# Patient Record
Sex: Female | Born: 1950 | Race: Black or African American | Hispanic: No | Marital: Single | Smoking: Former smoker
Health system: Southern US, Community
[De-identification: ages and names within clinical notes are randomized; demographics above are authoritative.]

## PROBLEM LIST (undated history)

## (undated) HISTORY — PX: ABDOMINAL HYSTERECTOMY: SHX81

## (undated) HISTORY — PX: TONSILLECTOMY: SUR1361

## (undated) HISTORY — PX: APPENDECTOMY: SHX54

---

## 2003-01-18 ENCOUNTER — Other Ambulatory Visit: Admission: RE | Admit: 2003-01-18 | Discharge: 2003-01-18 | Payer: Self-pay | Admitting: Family Medicine

## 2003-02-21 ENCOUNTER — Encounter: Payer: Self-pay | Admitting: Family Medicine

## 2003-02-21 ENCOUNTER — Encounter: Admission: RE | Admit: 2003-02-21 | Discharge: 2003-02-21 | Payer: Self-pay | Admitting: Family Medicine

## 2003-04-03 ENCOUNTER — Ambulatory Visit (HOSPITAL_COMMUNITY): Admission: RE | Admit: 2003-04-03 | Discharge: 2003-04-03 | Payer: Self-pay | Admitting: Gastroenterology

## 2004-03-20 ENCOUNTER — Emergency Department (HOSPITAL_COMMUNITY): Admission: EM | Admit: 2004-03-20 | Discharge: 2004-03-20 | Payer: Self-pay | Admitting: Emergency Medicine

## 2005-02-21 ENCOUNTER — Other Ambulatory Visit: Admission: RE | Admit: 2005-02-21 | Discharge: 2005-02-21 | Payer: Self-pay | Admitting: Family Medicine

## 2005-03-25 ENCOUNTER — Encounter: Admission: RE | Admit: 2005-03-25 | Discharge: 2005-03-25 | Payer: Self-pay | Admitting: Family Medicine

## 2006-11-03 ENCOUNTER — Emergency Department (HOSPITAL_COMMUNITY): Admission: EM | Admit: 2006-11-03 | Discharge: 2006-11-03 | Payer: Self-pay | Admitting: Emergency Medicine

## 2006-11-05 ENCOUNTER — Ambulatory Visit (HOSPITAL_COMMUNITY): Admission: RE | Admit: 2006-11-05 | Discharge: 2006-11-05 | Payer: Self-pay | Admitting: Orthopedic Surgery

## 2007-03-18 ENCOUNTER — Encounter: Admission: RE | Admit: 2007-03-18 | Discharge: 2007-03-18 | Payer: Self-pay | Admitting: Family Medicine

## 2007-04-07 ENCOUNTER — Other Ambulatory Visit: Admission: RE | Admit: 2007-04-07 | Discharge: 2007-04-07 | Payer: Self-pay | Admitting: Family Medicine

## 2008-09-19 ENCOUNTER — Other Ambulatory Visit: Admission: RE | Admit: 2008-09-19 | Discharge: 2008-09-19 | Payer: Self-pay | Admitting: Family Medicine

## 2008-10-17 ENCOUNTER — Encounter: Admission: RE | Admit: 2008-10-17 | Discharge: 2008-10-17 | Payer: Self-pay | Admitting: Family Medicine

## 2008-11-16 ENCOUNTER — Encounter (INDEPENDENT_AMBULATORY_CARE_PROVIDER_SITE_OTHER): Payer: Self-pay | Admitting: Obstetrics and Gynecology

## 2008-11-16 ENCOUNTER — Ambulatory Visit (HOSPITAL_COMMUNITY): Admission: RE | Admit: 2008-11-16 | Discharge: 2008-11-16 | Payer: Self-pay | Admitting: Obstetrics and Gynecology

## 2009-02-21 ENCOUNTER — Encounter (INDEPENDENT_AMBULATORY_CARE_PROVIDER_SITE_OTHER): Payer: Self-pay | Admitting: Obstetrics and Gynecology

## 2009-02-21 ENCOUNTER — Ambulatory Visit (HOSPITAL_COMMUNITY): Admission: RE | Admit: 2009-02-21 | Discharge: 2009-02-22 | Payer: Self-pay | Admitting: Obstetrics and Gynecology

## 2009-10-19 ENCOUNTER — Encounter: Admission: RE | Admit: 2009-10-19 | Discharge: 2009-10-19 | Payer: Self-pay | Admitting: Family Medicine

## 2010-06-01 ENCOUNTER — Encounter: Payer: Self-pay | Admitting: Family Medicine

## 2010-06-03 ENCOUNTER — Encounter: Payer: Self-pay | Admitting: Family Medicine

## 2010-08-15 LAB — CBC
HCT: 33.7 % — ABNORMAL LOW (ref 36.0–46.0)
HCT: 33.7 % — ABNORMAL LOW (ref 36.0–46.0)
Hemoglobin: 11.1 g/dL — ABNORMAL LOW (ref 12.0–15.0)
Platelets: 115 10*3/uL — ABNORMAL LOW (ref 150–400)
RBC: 3.68 MIL/uL — ABNORMAL LOW (ref 3.87–5.11)
RBC: 3.72 MIL/uL — ABNORMAL LOW (ref 3.87–5.11)
RDW: 13.5 % (ref 11.5–15.5)
WBC: 10.4 10*3/uL (ref 4.0–10.5)

## 2010-08-15 LAB — BASIC METABOLIC PANEL
BUN: 2 mg/dL — ABNORMAL LOW (ref 6–23)
CO2: 29 mEq/L (ref 19–32)
CO2: 29 mEq/L (ref 19–32)
Calcium: 8.6 mg/dL (ref 8.4–10.5)
Chloride: 103 mEq/L (ref 96–112)
Creatinine, Ser: 0.81 mg/dL (ref 0.4–1.2)
Creatinine, Ser: 0.88 mg/dL (ref 0.4–1.2)
GFR calc non Af Amer: >60 mL/min (ref >60)
Glucose, Bld: 111 mg/dL — ABNORMAL HIGH (ref 70–99)
Glucose, Bld: 122 mg/dL — ABNORMAL HIGH (ref 70–99)
Potassium: 3 mEq/L — ABNORMAL LOW (ref 3.5–5.1)
Sodium: 136 mEq/L (ref 135–145)

## 2010-08-18 LAB — CBC
Hemoglobin: 13.8 g/dL (ref 12.0–15.0)
MCHC: 34.3 g/dL (ref 30.0–36.0)
RBC: 4.46 MIL/uL (ref 3.87–5.11)
RDW: 13.5 % (ref 11.5–15.5)

## 2010-08-18 LAB — BASIC METABOLIC PANEL
CO2: 26 mEq/L (ref 19–32)
Calcium: 9.6 mg/dL (ref 8.4–10.5)
Chloride: 105 mEq/L (ref 96–112)
Sodium: 137 mEq/L (ref 135–145)

## 2010-09-24 NOTE — Op Note (Signed)
[name], Tiffany Wilcox NO.:  [id number]   MEDICAL RECORD NO.:  [id number]          PATIENT TYPE:  AMB   LOCATION:  SDC                           FACILITY:  [facility]   PHYSICIAN:  Maxie Better, M.D.DATE OF BIRTH:  Oct 28, 1950   DATE OF PROCEDURE:  11/16/2008  DATE OF DISCHARGE:  11/16/2008                               OPERATIVE REPORT   PREOPERATIVE DIAGNOSIS:  Atypical glandular cells on Pap, favor  endometrial source.   POSTOPERATIVE DIAGNOSIS:  Atypical glandular cells of undetermined  significance, submucosal fibroid.   PROCEDURE:  A cold-knife conization and cervical curettage, diagnostic  hysteroscopy, dilation and curettage.   ANESTHESIA:  MAC, paracervical block.   SURGEON:  Maxie Better, MD   ASSISTANT:  None.   DESCRIPTION OF PROCEDURE:  Under adequate monitored anesthesia, the  patient was placed in the dorsal lithotomy position.  She was sterilely  prepped and draped in usual fashion.  Bladder was catheterized for  moderate amount of urine.  A weighted speculum placed in the vagina  anteriorly.  The Sims retractor was then utilized.  The cervix was in  view.  Lugol's solution was applied to the cervix, which was parous.  No  lesions were noted.  A 0 Vicryl figure-of-eight sutures was placed at  the 3 and 9 o'clock positions.  A 1% lidocaine with epinephrine solution  was injected circumferentially in the cervix and the circumferential  incision was then made around the cervical os and carried down in a cone-  like fashion.  Conization was then performed.  At that point, the  specimen was removed and tagged at the 12 o'clock position.  The  endocervical canal was then gently curetted.  The internal os was then  further dilated up to #27 dilator, it would not accept the 29-Pratt  dilator.  A diagnostic hysteroscope was introduced.  A submucosal  fibroid was noted in inferior wall.  The left ostia was seen.  The right  was sclerosed.  The  endometrial cavity appeared to be atrophic, multiple  attempts at further dilating up the cervix to accommodate the  resectoscope was unsuccessful.  The cavity was therefore curetted, and  the diagnostic hysteroscope was reinserted to check the curettage of the  endometrial cavity and scant tissue was obtained.  At that point, the  hysteroscope was removed.  The bed of the cone was then fulgurated with  a ball.  When good hemostasis noted, Gelfoam x2 was placed with Monsel  in the cavity and the suture was tied in the midline overlying the  cervix.  All instruments were then removed from the vagina.  Specimen  with conization, endometrial, and endocervical curettage.  Estimated  blood loss was minimal.  Complications none.  The patient tolerated the  procedure well and was transferred to recovery in stable condition.       Maxie Better, M.D.  Electronically Signed    [place]/MEDQ  D:  11/16/2008  T:  11/17/2008  Job:  [id number]

## 2010-09-24 NOTE — Op Note (Signed)
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   ATTENDING SURGEON:  Madelynn Done, M.D., who was scrubbed and  present for the entire procedure.   ASSISTANT SURGEON:  Dionne Ano. Gramig, M.D., who was scrubbed and  present for reduction and placement of the K-wires.   PROCEDURE:  1. Percutaneous skeletal fixation of an unstable phalangeal shaft      fracture, left ring finger.  2. Radiographs three views left ring finger.   IMPLANTS:  Three 0.045 K-wires.   ANESTHESIA:  General via LMA.   TOURNIQUET TIME:  0 minutes.   COMPLICATIONS:  None.   CONDITION:  The patient was extubated and taken to the recovery room in  good condition.   SURGICAL INDICATIONS:  Tiffany Wilcox is a 60 year old right hand dominant  female who sustained a left ring finger injury in an accident.  The  patient presented to my office two days ago with pain and deformity to  the left ring finger with slight malrotation of the left ring finger and  displaced phalangeal shaft fracture.  The risks, benefits and  alternatives were discussed in detail with the patient and signed  informed consent was obtained on the day of surgery.  The risks include  but are not limited to bleeding, infection, nerve damage, nonunion,  malunion, malrotation, hardware failure, need for further surgery, loss  of motion, and stiffness in the fingers.   DESCRIPTION OF PROCEDURE:  The patient was properly identified in the  preoperative holding and a mark  with a permanent marker made on the left  ring finger to indicate the correct operative site.  The patient then  brought back to the operating room, placed supine on the operating  table.  General anesthesia was administered via LMA.  The patient  received preoperative antibiotics prior to any skin incisions.  A well  padded tourniquet was then placed on the left brachium and sealed with a  1000 drape.  The left upper extremity was prepped with Hibiclens and  sterilely draped.  A time out was called and the correct side was  identified and the surgical procedure was then begun.   Using a pointed reduction clamp, a clamp was then placed percutaneously  through the displaced phalangeal shaft fracture and then the clamp was  applied applying a slight back counter rotation force to obtain adequate  alignment in the AP, oblique, and lateral planes.  After placement of  the reduction clamp, two K-wires were then placed transversely across  the fracture site with good purchase and maintenance of the reduction.  A third K-wire was then placed from a distal to proximal direction in an  oblique fashion.  After placement of all three K-wires, the K-wires were  then cut and then using the mini C-arm, their positions were then  confirmed using the fluoroscopy.  After placement of the internal  fixation, the rotation was checked and there appeared to be improvement  in the rotation and good cascade of the digits in extension as well as  in digital flexion.  The K-wires were then cut and then bent and left  out of the skin.  Xeroform and pin caps were then applied around the pin  sites.  9 mL of 0.25% Marcaine were used to anesthetize the fingers for  a local digital block.  A sterile compressive dressing was then applied  and a well padded ulnar gutter splint was applied.  The patient was  extubated and taken to the recovery room in good condition.   INTRAOPERATIVE RADIOGRAPHS:  Three views of the  left ring finger do show  the three K-wires in place holding the proximal phalanx reduced.  There  was adequate position of the K-wires.   PLAN:  The patient will be discharged to home.  I plan to see her back  in two weeks for splint change, pin check, and reapplication of an ulnar  gutter type splint for a total of four weeks immobilization, pins out at  four weeks.  Then begin active and active assist range of motion at that  point.  Radiographs at the two week mark, four week mark, and then the  eight week mark.      Madelynn Done, MD  Electronically Signed     FWO/MEDQ  D:  11/05/2006  T:  11/06/2006  Job:  [id number]

## 2010-09-27 NOTE — Op Note (Signed)
Tiffany Wilcox, Tiffany Wilcox NO.:  [id number]   MEDICAL RECORD NO.:  [id number]                   PATIENT TYPE:  AMB   LOCATION:  ENDO                                 FACILITY:  [facility]   PHYSICIAN:  Graylin Shiver, M.D.                DATE OF BIRTH:  July 10, 1950   DATE OF PROCEDURE:  04/03/2003  DATE OF DISCHARGE:                                 OPERATIVE REPORT   PROCEDURE:  Colonoscopy.   ENDOSCOPIST:  Graylin Shiver, M.D.   INDICATIONS FOR PROCEDURE:  Screening.   INFORMED CONSENT:  An informed consent was obtained after an explanation of  the risks of bleeding, infection and perforation.   PRE-MEDICATION:  Fentanyl 70 mcg IV, Versed 7 mg IV.   DESCRIPTION OF PROCEDURE:  With the patient in the left lateral decubitus  position, a rectal examination was performed, and no masses were felt.  The  Olympus colonoscope was inserted into the rectum and advanced around the  colon to the cecum.  The cecal landmarks were identified.  The cecum and  ascending colon were normal.  The transverse colon was normal.  The  descending colon and sigmoid revealed diverticulosis.  The rectum was  normal.  She tolerated the procedure well without complications.   IMPRESSION:  Diverticulosis of the left colon.                                               Graylin Shiver, M.D.    SFG/MEDQ  D:  04/03/2003  T:  04/03/2003  Job:  [id number]   cc:   Sigmund Hazel, M.D.  [address]  [address], [place] [address]  Fax: [phone]

## 2011-02-26 LAB — BASIC METABOLIC PANEL
Chloride: 109
GFR calc non Af Amer: >60
Glucose, Bld: 100 — ABNORMAL HIGH
Potassium: 3.4 — ABNORMAL LOW

## 2011-02-26 LAB — CBC
HCT: 40.5
MCV: 88.2
Platelets: 284
RBC: 4.59
RDW: 13.4
WBC: 4.6

## 2011-07-01 ENCOUNTER — Other Ambulatory Visit: Payer: Self-pay | Admitting: Family Medicine

## 2011-07-01 ENCOUNTER — Ambulatory Visit
Admission: RE | Admit: 2011-07-01 | Discharge: 2011-07-01 | Disposition: A | Discharge status: Home / Self Care | Payer: 59 | Source: Ambulatory Visit | Attending: Family Medicine | Admitting: Family Medicine

## 2011-07-01 DIAGNOSIS — R0602 Shortness of breath: Secondary | ICD-10-CM

## 2011-11-28 ENCOUNTER — Other Ambulatory Visit: Payer: Self-pay | Admitting: Family Medicine

## 2011-11-28 DIAGNOSIS — Z1231 Encounter for screening mammogram for malignant neoplasm of breast: Secondary | ICD-10-CM

## 2011-12-12 ENCOUNTER — Ambulatory Visit: Payer: 59

## 2011-12-15 ENCOUNTER — Ambulatory Visit
Admission: RE | Admit: 2011-12-15 | Discharge: 2011-12-15 | Disposition: A | Discharge status: Home / Self Care | Payer: 59 | Source: Ambulatory Visit | Attending: Family Medicine | Admitting: Family Medicine

## 2011-12-15 DIAGNOSIS — Z1231 Encounter for screening mammogram for malignant neoplasm of breast: Secondary | ICD-10-CM

## 2013-06-01 ENCOUNTER — Other Ambulatory Visit: Payer: Self-pay

## 2013-06-01 DIAGNOSIS — Z1231 Encounter for screening mammogram for malignant neoplasm of breast: Secondary | ICD-10-CM

## 2013-06-17 ENCOUNTER — Ambulatory Visit
Admission: RE | Admit: 2013-06-17 | Discharge: 2013-06-17 | Disposition: A | Discharge status: Home / Self Care | Payer: 59 | Source: Ambulatory Visit

## 2013-06-17 DIAGNOSIS — Z1231 Encounter for screening mammogram for malignant neoplasm of breast: Secondary | ICD-10-CM

## 2014-06-14 ENCOUNTER — Other Ambulatory Visit: Payer: Self-pay

## 2014-06-14 DIAGNOSIS — Z1231 Encounter for screening mammogram for malignant neoplasm of breast: Secondary | ICD-10-CM

## 2014-06-22 ENCOUNTER — Ambulatory Visit
Admission: RE | Admit: 2014-06-22 | Discharge: 2014-06-22 | Disposition: A | Discharge status: Home / Self Care | Payer: 59 | Source: Ambulatory Visit

## 2014-06-22 ENCOUNTER — Encounter (INDEPENDENT_AMBULATORY_CARE_PROVIDER_SITE_OTHER): Payer: Self-pay

## 2014-06-22 DIAGNOSIS — Z1231 Encounter for screening mammogram for malignant neoplasm of breast: Secondary | ICD-10-CM

## 2018-05-26 ENCOUNTER — Other Ambulatory Visit: Payer: Self-pay | Admitting: Family Medicine

## 2018-05-26 DIAGNOSIS — Z1231 Encounter for screening mammogram for malignant neoplasm of breast: Secondary | ICD-10-CM

## 2018-05-26 DIAGNOSIS — E2839 Other primary ovarian failure: Secondary | ICD-10-CM

## 2018-07-23 ENCOUNTER — Ambulatory Visit
Admission: RE | Admit: 2018-07-23 | Discharge: 2018-07-23 | Disposition: A | Discharge status: Home / Self Care | Payer: Medicare Other | Source: Ambulatory Visit | Attending: Family Medicine | Admitting: Family Medicine

## 2018-07-23 ENCOUNTER — Other Ambulatory Visit: Payer: Self-pay

## 2018-07-23 DIAGNOSIS — Z1231 Encounter for screening mammogram for malignant neoplasm of breast: Secondary | ICD-10-CM

## 2018-08-20 ENCOUNTER — Other Ambulatory Visit: Payer: Self-pay

## 2018-11-26 ENCOUNTER — Other Ambulatory Visit: Payer: Self-pay

## 2018-11-26 ENCOUNTER — Ambulatory Visit
Admission: RE | Admit: 2018-11-26 | Discharge: 2018-11-26 | Disposition: A | Discharge status: Home / Self Care | Payer: Medicare Other | Source: Ambulatory Visit | Attending: Family Medicine | Admitting: Family Medicine

## 2018-11-26 DIAGNOSIS — E2839 Other primary ovarian failure: Secondary | ICD-10-CM

## 2019-07-07 ENCOUNTER — Ambulatory Visit: Payer: Medicare HMO | Attending: Internal Medicine

## 2019-07-07 DIAGNOSIS — Z23 Encounter for immunization: Secondary | ICD-10-CM | POA: Insufficient documentation

## 2019-07-07 NOTE — Progress Notes (Signed)
   Covid-19 Vaccination Clinic  Name:  Tiffany Wilcox    MRN: [id number] DOB: 1951-03-31  07/07/2019  [name] was observed post Covid-19 immunization for 15 minutes without incidence. She was provided with Vaccine Information Sheet and instruction to access the V-Safe system.   [name] was instructed to call 911 with any severe reactions post vaccine: Marland Kitchen Difficulty breathing  . Swelling of your face and throat  . A fast heartbeat  . A bad rash all over your body  . Dizziness and weakness    Immunizations Administered    Name Date Dose VIS Date Route   Pfizer COVID-19 Vaccine 07/07/2019 12:20 PM 0.3 mL 04/22/2019 Intramuscular   Manufacturer: [employer], [employer]   Lot: [id number]   NDC: [address]-3

## 2019-07-28 DIAGNOSIS — E785 Hyperlipidemia, unspecified: Secondary | ICD-10-CM | POA: Diagnosis not present

## 2019-07-28 DIAGNOSIS — I10 Essential (primary) hypertension: Secondary | ICD-10-CM | POA: Diagnosis not present

## 2019-07-28 DIAGNOSIS — E2839 Other primary ovarian failure: Secondary | ICD-10-CM | POA: Diagnosis not present

## 2019-07-28 DIAGNOSIS — Z0000 Encounter for general adult medical examination without abnormal findings: Secondary | ICD-10-CM | POA: Diagnosis not present

## 2019-08-02 ENCOUNTER — Ambulatory Visit: Payer: Medicare HMO | Attending: Internal Medicine

## 2019-08-02 DIAGNOSIS — Z23 Encounter for immunization: Secondary | ICD-10-CM

## 2019-08-02 NOTE — Progress Notes (Signed)
   Covid-19 Vaccination Clinic  Name:  Tiffany Wilcox    MRN: [id number] DOB: [birth year]  08/02/2019  Tiffany Wilcox was observed post Covid-19 immunization for 15 minutes without incident. She was provided with Vaccine Information Sheet and instruction to access the V-Safe system.   Tiffany Wilcox was instructed to call 911 with any severe reactions post vaccine: Marland Kitchen Difficulty breathing  . Swelling of face and throat  . A fast heartbeat  . A bad rash all over body  . Dizziness and weakness   Immunizations Administered    Name Date Dose VIS Date Route   Pfizer COVID-19 Vaccine 08/02/2019 12:10 PM 0.3 mL 04/22/2019 Intramuscular   Manufacturer: [employer], [employer]   Lot: [id number]   NDC: [address]-2

## 2020-02-02 DIAGNOSIS — Z23 Encounter for immunization: Secondary | ICD-10-CM | POA: Diagnosis not present

## 2020-02-10 ENCOUNTER — Other Ambulatory Visit: Payer: Medicare HMO

## 2020-02-10 DIAGNOSIS — Z20822 Contact with and (suspected) exposure to covid-19: Secondary | ICD-10-CM | POA: Diagnosis not present

## 2020-02-11 LAB — SARS-COV-2, NAA 2 DAY TAT

## 2020-02-11 LAB — NOVEL CORONAVIRUS, NAA: SARS-CoV-2, NAA: NOT DETECTED

## 2020-02-25 DIAGNOSIS — Z87891 Personal history of nicotine dependence: Secondary | ICD-10-CM | POA: Diagnosis not present

## 2020-02-25 DIAGNOSIS — Z833 Family history of diabetes mellitus: Secondary | ICD-10-CM | POA: Diagnosis not present

## 2020-02-25 DIAGNOSIS — Z8249 Family history of ischemic heart disease and other diseases of the circulatory system: Secondary | ICD-10-CM | POA: Diagnosis not present

## 2020-02-25 DIAGNOSIS — Z803 Family history of malignant neoplasm of breast: Secondary | ICD-10-CM | POA: Diagnosis not present

## 2020-02-25 DIAGNOSIS — G8929 Other chronic pain: Secondary | ICD-10-CM | POA: Diagnosis not present

## 2020-02-25 DIAGNOSIS — M1990 Unspecified osteoarthritis, unspecified site: Secondary | ICD-10-CM | POA: Diagnosis not present

## 2020-08-01 DIAGNOSIS — H539 Unspecified visual disturbance: Secondary | ICD-10-CM | POA: Diagnosis not present

## 2020-08-01 DIAGNOSIS — Z0000 Encounter for general adult medical examination without abnormal findings: Secondary | ICD-10-CM | POA: Diagnosis not present

## 2020-08-01 DIAGNOSIS — E785 Hyperlipidemia, unspecified: Secondary | ICD-10-CM | POA: Diagnosis not present

## 2020-08-01 DIAGNOSIS — M25512 Pain in left shoulder: Secondary | ICD-10-CM | POA: Diagnosis not present

## 2020-08-01 DIAGNOSIS — E2839 Other primary ovarian failure: Secondary | ICD-10-CM | POA: Diagnosis not present

## 2020-08-01 DIAGNOSIS — Z23 Encounter for immunization: Secondary | ICD-10-CM | POA: Diagnosis not present

## 2020-08-01 DIAGNOSIS — I10 Essential (primary) hypertension: Secondary | ICD-10-CM | POA: Diagnosis not present

## 2020-08-02 DIAGNOSIS — M25512 Pain in left shoulder: Secondary | ICD-10-CM | POA: Diagnosis not present

## 2020-08-03 ENCOUNTER — Other Ambulatory Visit: Payer: Self-pay | Admitting: Family Medicine

## 2020-08-03 DIAGNOSIS — Z13820 Encounter for screening for osteoporosis: Secondary | ICD-10-CM

## 2020-08-03 DIAGNOSIS — E2839 Other primary ovarian failure: Secondary | ICD-10-CM

## 2020-08-23 ENCOUNTER — Other Ambulatory Visit: Payer: Self-pay | Admitting: Family Medicine

## 2020-08-23 DIAGNOSIS — Z1231 Encounter for screening mammogram for malignant neoplasm of breast: Secondary | ICD-10-CM

## 2020-09-01 ENCOUNTER — Ambulatory Visit: Payer: Medicare HMO

## 2020-09-05 DIAGNOSIS — M7582 Other shoulder lesions, left shoulder: Secondary | ICD-10-CM | POA: Diagnosis not present

## 2020-09-05 DIAGNOSIS — M65312 Trigger thumb, left thumb: Secondary | ICD-10-CM | POA: Diagnosis not present

## 2020-09-05 DIAGNOSIS — M25511 Pain in right shoulder: Secondary | ICD-10-CM | POA: Diagnosis not present

## 2020-10-03 ENCOUNTER — Encounter: Payer: Medicare HMO | Admitting: Physical Therapy

## 2020-10-03 DIAGNOSIS — Z8042 Family history of malignant neoplasm of prostate: Secondary | ICD-10-CM | POA: Diagnosis not present

## 2020-10-03 DIAGNOSIS — E785 Hyperlipidemia, unspecified: Secondary | ICD-10-CM | POA: Diagnosis not present

## 2020-10-03 DIAGNOSIS — M1990 Unspecified osteoarthritis, unspecified site: Secondary | ICD-10-CM | POA: Diagnosis not present

## 2020-10-03 DIAGNOSIS — Z833 Family history of diabetes mellitus: Secondary | ICD-10-CM | POA: Diagnosis not present

## 2020-10-03 DIAGNOSIS — Z825 Family history of asthma and other chronic lower respiratory diseases: Secondary | ICD-10-CM | POA: Diagnosis not present

## 2020-10-03 DIAGNOSIS — Z87891 Personal history of nicotine dependence: Secondary | ICD-10-CM | POA: Diagnosis not present

## 2020-10-03 DIAGNOSIS — Z803 Family history of malignant neoplasm of breast: Secondary | ICD-10-CM | POA: Diagnosis not present

## 2020-10-03 DIAGNOSIS — Z8249 Family history of ischemic heart disease and other diseases of the circulatory system: Secondary | ICD-10-CM | POA: Diagnosis not present

## 2020-10-03 DIAGNOSIS — G8929 Other chronic pain: Secondary | ICD-10-CM | POA: Diagnosis not present

## 2020-10-09 ENCOUNTER — Ambulatory Visit: Payer: Medicare HMO | Attending: Sports Medicine | Admitting: Occupational Therapy

## 2020-10-09 ENCOUNTER — Encounter: Payer: Self-pay | Admitting: Physical Therapy

## 2020-10-09 ENCOUNTER — Other Ambulatory Visit: Payer: Self-pay

## 2020-10-09 ENCOUNTER — Encounter: Payer: Self-pay | Admitting: Occupational Therapy

## 2020-10-09 ENCOUNTER — Ambulatory Visit: Payer: Medicare HMO | Admitting: Physical Therapy

## 2020-10-09 DIAGNOSIS — M25512 Pain in left shoulder: Secondary | ICD-10-CM | POA: Insufficient documentation

## 2020-10-09 DIAGNOSIS — M79642 Pain in left hand: Secondary | ICD-10-CM | POA: Diagnosis present

## 2020-10-09 DIAGNOSIS — G8929 Other chronic pain: Secondary | ICD-10-CM

## 2020-10-09 DIAGNOSIS — M65312 Trigger thumb, left thumb: Secondary | ICD-10-CM | POA: Diagnosis present

## 2020-10-09 DIAGNOSIS — M25642 Stiffness of left hand, not elsewhere classified: Secondary | ICD-10-CM | POA: Insufficient documentation

## 2020-10-09 NOTE — Therapy (Signed)
[facility] [address], [place], [address] Phone: [phone]   Fax:  [phone]  Physical Therapy Evaluation  Patient Details  Name: Tiffany Wilcox MRN: [id number] Date of Birth: 03-17-1951 No data recorded  Encounter Date: 10/09/2020   PT End of Session - 10/09/20 1302    Visit Number 1    Number of Visits 17    Date for PT Re-Evaluation 12/07/20    PT Start Time 1033    PT Stop Time 1115    PT Time Calculation (min) 42 min    Activity Tolerance Patient tolerated treatment well    Behavior During Therapy [facility] for tasks assessed/performed           History reviewed. No pertinent past medical history.  History reviewed. No pertinent surgical history.  There were no vitals filed for this visit.     OBJECTIVE  MUSCULOSKELETAL: Tremor: Normal Bulk: Normal Tone: Normal  Cervical Screen AROM: WFL and painless with overpressure in all planes; EXCEPT Lateral flex R 30d L 32d with stiffness at bilat UT Spurlings A (ipsilateral lateral flexion/axial compression): NEGATIVE Spurlings B (ipsilateral lateral flexion/contralateral rotation/axial compression): NEGATIVE Repeated movement: No centralization or peripheralization with protraction or retraction  Elbow Screen Elbow AROM: WNL with pain with L ext at proximal bicep  Palpation No pain with palpation to anterior, posterior, lateral, and superior shoulder  Palpation: TTP at proximal bicep insertion, and at distal infraspinatus insertion; concordant pain sites. Secondary pain with trigger points to L UT, and pec minor  Observation: scapular dyskinesis with flexion and abd attempts. Increased UT involvement and shoulder hiking for overhead attempt. Forward head rounded shoulders.   Strength R/L 5/2+ Shoulder flexion (anterior deltoid/pec major/coracobrachialis, axillary n. (C5-6) and musculocutaneous n. (C5-7)) 5/2+ Shoulder abduction  (deltoid/supraspinatus, axillary/suprascapular n, C5) 5/4 Shoulder external rotation (infraspinatus/teres minor) 5/4+ Shoulder internal rotation (subcapularis/lats/pec major) 5/5 Shoulder extension (posterior deltoid, lats, teres major, axillary/thoracodorsal n.) 5/5 Shoulder horizontal abduction 5/5 Elbow flexion (biceps brachii, brachialis, brachioradialis, musculoskeletal n, C5-6) 5/5 Elbow extension (triceps, radial n, C7)  AROM R/L 180/80 Shoulder flexion 180/72 Shoulder abduction C8/C5 Shoulder external rotation T10/S1 Shoulder internal rotation 60/60 Shoulder extension *Indicates pain, overpressure performed unless otherwise indicated  PROM R/L 180/144 Shoulder flexion 180/123 Shoulder abduction 90/71 Shoulder external rotation 70/59 Shoulder internal rotation 60/60 Shoulder extension *Indicates pain, overpressure performed unless otherwise indicated  Accessory Motions/Glides Glenohumeral: All WNL with guarding; with patient reporting reduced pain with posterior mobilization  Acromioclavicular:  A and P WNL  Sternoclavicular: All WNL  Scapulothoracic: All WNL with some difficulty with depression d/t muscle tension   Muscle Length Testing Pectoralis Major: WNL Pectoralis Minor: WNL with tension on L Biceps: WNL with tension at end range L elbow ext  NEUROLOGICAL:  Mental Status Patient is oriented to person, place and time.  Recent memory is intact.  Remote memory is intact.  Attention span and concentration are intact.  Expressive speech is intact.  Patient's fund of knowledge is within normal limits for educational level.   Sensation Grossly intact to light touch bilateral UE as determined by testing dermatomes C2-T2 Proprioception and hot/cold testing deferred on this date  SPECIAL TESTS  Rotator Cuff  Drop Arm Test:Negative  Painful Arc (Pain from 60 to 120 degrees scaption): Positive at 80d Infraspinatus Muscle Test:Negative   Subacromial  Impingement Hawkins-Kennedy:Postiive  Neer (Block scapula, PROM flexion):positive Painful Arc (Pain from 60 to 120 degrees scaption): Positive Empty Can: Positive External  Rotation Resistance: Positive Horizontal Adduction: Negative  Scapular Assist: Positive  Labral Tear Biceps Load II (120 elevation, full ER, 90 elbow flexion, full supination, resisted elbow flexion): Negative Crank (160 scaption, axial load with IR/ER): Negative  Active Compression Test: Positive  Bicep Tendon Pathology Speed (shoulder flexion to 90, external rotation, full elbow extension, and forearm supination with resistance: Positive  Yergason's (resisted shoulder ER and supination/biceps tendon pathology): positive with "little pain"  Shoulder Instability Sulcus Sign:Negative  Anterior Apprehension: Negative    Ther-Ex PT reviewed the following HEP with patient with patient able to demonstrate a set of the following with min cuing for correction needed. PT educated patient on parameters of therex (how/when to inc/decrease intensity, frequency, rep/set range, stretch hold time, and purpose of therex) with verbalized understanding.  Access Code: [email or web address] Seated Shoulder Abduction AAROM with Pulley Behind - 2-3 x daily - 7 x weekly - 2-3sec hold Seated Shoulder Flexion AAROM with Pulley Behind - 2-3 x daily - 7 x weekly - 2-3sec hold Seated Upper Trapezius Stretch - 2-3 x daily - 7 x weekly - 30sec hold                         Objective measurements completed on examination: See above findings.               PT Education - 10/09/20 1047    Education Details Patient was educated on diagnosis, anatomy and pathology involved, prognosis, role of PT, and was given an HEP, demonstrating exercise with proper form following verbal and tactile cues, and was given a paper hand out to continue exercise at home. Pt was educated on and agreed to plan of care.    Person(s) Educated Patient     Methods Explanation;Demonstration;Verbal cues;Tactile cues    Comprehension Verbalized understanding;Returned demonstration;Tactile cues required;Verbal cues required            PT Short Term Goals - 10/09/20 1311      PT SHORT TERM GOAL #1   Title Pt will be independent with HEP in order to improve strength and decrease pain in order to improve pain-free function at home and work.    Baseline 10/09/20 HEP given    Time 4    Period Weeks    Status New             PT Long Term Goals - 10/09/20 1312      PT LONG TERM GOAL #1   Title Pt will decrease worst pain as reported on NPRS by at least 3 points in order to demonstrate clinically significant reduction in pain.    Baseline 10/09/20 8/10    Time 8    Period Weeks    Status New      PT LONG TERM GOAL #2   Title Pt will demonstrate full L active shoulder ROM in order to complete household ADLs and self care to reflect PLOF    Baseline 10/09/20 flex: 80d abd: 72d ER: C5 IR: S1    Time 8    Period Weeks    Status New      PT LONG TERM GOAL #3   Title Patient will demonstrate all shoulder MMTs to at least 4+/5 in order to complete heavy household ADLs and return to PLOF    Baseline 10/09/20 flex: 2+ abd: 2+ ER: 4  IR: 4+    Time 8    Status New      PT LONG  TERM GOAL #4   Title Patient will increase FOTO score to 67 to demonstrate predicted increase in functional mobility to complete ADLs    Baseline 10/09/20 50    Time 8    Period Weeks    Status New                  Plan - 10/09/20 1317    Clinical Impression Statement Pt is a 70y/o female presenting with L shoulder pain. Pt reports RTC tear, through physical exam appears to be infraspinatus; signs and symptoms of bicep tendon pathology as well. Impairments in AROM, PROM, RTC and periscapular strength, pain, scapular dyskinesis, and abnormal posutre. Activity limitations in overhead reaching, bathing, sleeping, donning/doffing clothing, lifting, and  pushing/pulling; inhibiting participation in ind household ADLs. Pt will benefit from skilled PT to address aforementioned impairments to return to optimal PLOF.    Personal Factors and Comorbidities Age;Sex;Fitness;Past/Current Experience;Time since onset of injury/illness/exacerbation    Examination-Activity Limitations Bathing;Reach Overhead;Lift;Carry;Sleep;Bed Mobility    Examination-Participation Restrictions Driving;Yard Work;Meal Prep;Cleaning;Laundry;Community Activity    Stability/Clinical Decision Making Evolving/Moderate complexity    Clinical Decision Making Moderate    Rehab Potential Good    PT Frequency 2x / week    PT Duration 8 weeks    PT Treatment/Interventions ADLs/Self Care Home Management;Iontophoresis 4mg /ml Dexamethasone;Therapeutic exercise;Passive range of motion;Joint Manipulations;Spinal Manipulations;Dry needling;Manual techniques;Neuromuscular re-education;Therapeutic activities;Moist Heat;Electrical Stimulation;Cryotherapy;Ultrasound;DME Instruction;Functional mobility training;Balance training;Patient/family education    PT Next Visit Plan Y/T/I MMT, TDN?    PT Home Exercise Plan pulleys and UT stretch    Consulted and Agree with Plan of Care Patient           Patient will benefit from skilled therapeutic intervention in order to improve the following deficits and impairments:  Decreased coordination,Decreased endurance,Decreased range of motion,Decreased strength,Decreased mobility,Hypomobility,Increased fascial restricitons,Impaired UE functional use,Improper body mechanics,Pain,Postural dysfunction,Impaired flexibility,Increased muscle spasms  Visit Diagnosis: Chronic left shoulder pain     Problem List There are no problems to display for this patient.  DPT Hilda Lias 10/09/2020, 1:40 PM  [facility] [address], [place] Phone: [phone]    Fax:  [phone]  Name: Tiffany Wilcox MRN: Lenore Manner Date of Birth: 1950-06-18

## 2020-10-09 NOTE — Therapy (Signed)
[facility] [address], [place], [address] Phone: [phone]   Fax:  [phone]  Occupational Therapy Evaluation  Patient Details  Name: Tiffany Wilcox MRN: [id number] Date of Birth: Jun 18, 1950 Referring Provider (OT): Dr Sheppard Coil   Encounter Date: 10/09/2020   OT End of Session - 10/09/20 1258    Visit Number 1    Number of Visits 12    Date for OT Re-Evaluation 11/20/20    OT Start Time 0945    OT Stop Time 1030    OT Time Calculation (min) 45 min    Activity Tolerance Patient tolerated treatment well           History reviewed. No pertinent past medical history.  History reviewed. No pertinent surgical history.  There were no vitals filed for this visit.   Subjective Assessment - 10/09/20 1239    Subjective  My trigger thumb started about 2 months ago - I crochet a lot ,and in the garden , work part time in office 2 days a week - but the thumb hurts mostly at night time - but today some what quiet- but it wants to  lock    Pertinent History L trigger thumb started about 2 months ago - her PCP refer her to DR Sheppard Coil -pt decline shot and refer to OT for L trigger thumb    Patient Stated Goals Want my L thumb pain and motion better so I can crochet , work in the yard and use it like before    Currently in Pain? 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, crochet, tai chi, walk, gardening      Right Hand AROM   R Thumb MCP 0-60 30 Degrees    R Thumb IP 0-80 70 Degrees    R Thumb Radial ABduction/ADduction 0-55 60    R Thumb Palmar ABduction/ADduction 0-45 50    R Thumb Opposition to Index --   Opposition to base of 5th     Left Hand AROM   L Thumb MCP 0-60 30 Degrees    L Thumb IP 0-80 60 Degrees    L Thumb Radial ADduction/ABduction 0-55 55    L Thumb Palmar ADduction/ABduction 0-45 50    L Thumb Opposition to Index --   Opposition to 3rd pain full , and cannot do 4th            Pt ed on HEP - contrast - 2 x day  AROM pain free for PA and RA  And opposition - picking up 2 cm object without pain and triggering  Ice massage several times  during day over A1pulley at thumb  And modify grip - use palms or larger joint, avoid lat grip or pinch and enlarge grips   Can use night time bandaid on thumb IP to decrease locking and first day or 2 as reminder during day        OT Treatments/Exercises (OP) - 10/09/20 0001      Iontophoresis   Type of Iontophoresis Dexamethasone    Location L thumb A1pulley    Dose med patch , 2 current    Time 60                 OT Education - 10/09/20 1258    Education Details findings of eval and HEP    Person(s) Educated Patient    Methods Explanation;Verbal cues;Tactile cues;Demonstration;Handout    Comprehension Verbal cues required;Returned demonstration;Verbalized understanding            OT Short Term Goals - 10/09/20 1303      OT SHORT TERM GOAL #1   Title Pt to be indepedent in HEP to decrease pain, tendernes and triggering in L thumb    Baseline pain 4-7/10 and night time can increase to 10/10 , and tender - decrease AROM IP 60 and opposition - triggering several times at night time    Time 3    Period Weeks    Status New    Target Date 10/30/20             OT Long Term Goals - 10/09/20 1304      OT  LONG TERM GOAL #1   Title L thumb AROM improve to WNL to use in ADL's without triggering    Baseline decrease RA and pain with flexion of thumb and opposition .Triggering worse nigh time    Time 5    Target Date 11/13/20      OT LONG TERM GOAL #2   Title Pt be independent in modifications to decrease risk for triggering and return to prior level of function    Baseline no knowledge on modifications and tasks that increase her risk for trigger thumb    Time 6    Period Weeks    Status New    Target Date 11/20/20                 Plan - 10/09/20 1300    Clinical Impression Statement Pt refer to OT with L non dominant trigger thumb. Pt pain in thumb from 4-7/10 , tenderness over A1pulley 10/10 and at night time. Pt show increase stiffness in AROM and decrease strength using L thumb. Pt ed on modfications to tasks , immobilization to IP at night time and to decrease pain and increase AROM withtout triggering and pain - pt limited in use of L hand in ADlL's and IADL's    OT Occupational Profile and History Problem Focused Assessment - Including review of records relating to presenting problem    Occupational performance deficits (Please refer to evaluation for details): ADL's;IADL's;Rest and Sleep;Play;Leisure;Social Participation    Body Structure / Function / Physical Skills ADL;Strength;Decreased knowledge of use of DME;Pain;UE functional use;ROM;IADL;Flexibility;FMC    Rehab Potential Fair    Clinical Decision Making Limited treatment options, no task modification necessary    Comorbidities Affecting Occupational Performance: None    OT Frequency 2x / week    OT Duration 6 weeks    OT Treatment/Interventions Self-care/ADL training;Cryotherapy;Iontophoresis;Ultrasound;Therapeutic exercise;Contrast Bath;Splinting;DME and/or AE instruction;Passive range of motion;Manual Therapy    Consulted and  Agree with Plan of Care Patient           Patient will benefit from skilled therapeutic  intervention in order to improve the following deficits and impairments:   Body Structure / Function / Physical Skills: ADL,Strength,Decreased knowledge of use of DME,Pain,UE functional use,ROM,IADL,Flexibility,FMC       Visit Diagnosis: Trigger thumb of left hand - Plan: Ot plan of care cert/re-cert  Pain in left hand - Plan: Ot plan of care cert/re-cert  Stiffness of left hand, not elsewhere classified - Plan: Ot plan of care cert/re-cert    Problem List There are no problems to display for this patient.   Rosalyn Gess OTR/l,CLT 10/09/2020, 1:09 PM  [facility] [address], [place], [address] Phone: [phone]   Fax:  [phone]  Name: Tiffany Wilcox MRN: [id number] Date of Birth: 05/22/1950

## 2020-10-11 ENCOUNTER — Other Ambulatory Visit: Payer: Self-pay

## 2020-10-11 ENCOUNTER — Ambulatory Visit: Payer: Medicare HMO | Attending: Sports Medicine | Admitting: Occupational Therapy

## 2020-10-11 DIAGNOSIS — G8929 Other chronic pain: Secondary | ICD-10-CM | POA: Diagnosis present

## 2020-10-11 DIAGNOSIS — M79642 Pain in left hand: Secondary | ICD-10-CM

## 2020-10-11 DIAGNOSIS — M25642 Stiffness of left hand, not elsewhere classified: Secondary | ICD-10-CM | POA: Diagnosis present

## 2020-10-11 DIAGNOSIS — M25512 Pain in left shoulder: Secondary | ICD-10-CM | POA: Insufficient documentation

## 2020-10-11 DIAGNOSIS — M65312 Trigger thumb, left thumb: Secondary | ICD-10-CM | POA: Diagnosis not present

## 2020-10-11 NOTE — Therapy (Signed)
[facility] [address] [place], [address] Phone: [phone]   Fax:  [phone]  Occupational Therapy Treatment  Patient Details  Name: Tiffany Wilcox MRN: [id number] Date of Birth: 03-Oct-1950 Referring Provider (OT): Dr Lyn Hollingshead   Encounter Date: 10/11/2020   OT End of Session - 10/11/20 0936    Visit Number 2    Number of Visits 12    Date for OT Re-Evaluation 11/20/20    OT Start Time 0800    OT Stop Time 0833    OT Time Calculation (min) 33 min    Activity Tolerance Patient tolerated treatment well    Behavior During Therapy [facility] for tasks assessed/performed           No past medical history on file.  No past surgical history on file.  There were no vitals filed for this visit.   Subjective Assessment - 10/11/20 0934    Subjective  I did not do anything this morning yet - thumb little sore and stiff- but I could tell my thumb felt better last time after I left    Pertinent History L trigger thumb started about 2 months ago - her PCP refer her to DR Lyn Hollingshead -pt decline shot and refer to OT for L trigger thumb    Patient Stated Goals Want my L thumb pain and motion better so I can crochet , work in the yard and use it like before    Currently in Pain? Yes    Pain Score 4     Pain Location Finger (Comment which one)    Pain Orientation Left    Pain Descriptors / Indicators Tightness    Pain Type Acute pain    Pain Onset More than a month ago    Pain Frequency Intermittent                  Review again with HEP - contrast - 2 x day  AROM pain free for PA and RA  And opposition - picking up 2 cm object without pain and triggering  Ice massage several times during day over A1pulley at thumb  And modify grip - use palms or larger joint, avoid lat grip or pinch and enlarge grips - for gardening  Can use night time bandaid on thumb IP to decrease locking and but not during day  anymore  Skin check done - no issues tolerating ionto - and pt to keep patch on for hour afterwards        OT Treatments/Exercises (OP) - 10/11/20 0001      Iontophoresis   Type of Iontophoresis Dexamethasone    Location L thumb A1pulley    Dose med patch , 2 current    Time 10                  OT Education - 10/11/20 0935    Education Details Ionto use and HEP    Person(s) Educated Patient    Methods Explanation;Verbal cues;Tactile cues;Demonstration;Handout    Comprehension Verbal cues required;Returned demonstration;Verbalized understanding            OT Short Term Goals - 10/09/20 1303      OT SHORT TERM GOAL #1   Title Pt to be indepedent in HEP to decrease pain, tendernes and triggering in L thumb    Baseline pain 4-7/10 and night time can increase to 10/10 , and tender - decrease AROM IP 60 and  opposition - triggering several times at night time    Time 3    Period Weeks    Status New    Target Date 10/30/20             OT Long Term Goals - 10/09/20 1304      OT LONG TERM GOAL #1   Title L thumb AROM improve to WNL to use in ADL's without triggering    Baseline decrease RA and pain with flexion of thumb and opposition .Triggering worse nigh time    Time 5    Target Date 11/13/20      OT LONG TERM GOAL #2   Title Pt be independent in modifications to decrease risk for triggering and return to prior level of function    Baseline no knowledge on modifications and tasks that increase her risk for trigger thumb    Time 6    Period Weeks    Status New    Target Date 11/20/20                 Plan - 10/11/20 0936    Clinical Impression Statement Pt refer to OT with L non dominant trigger thumb. Pt pain at eval in thumb from 4-7/10 , tenderness over A1pulley 10/10 and at night time. Pt show increase stiffness in AROM and decrease strength using L thumb. Pt ed on modfications to tasks again this date and  immobilization to IP at night time  and to decrease pain and increase AROM withtout triggering and pain - 2nd session of ionto done this date - tolerated well - pt limited in use of L hand in ADlL's and IADL's    OT Occupational Profile and History Problem Focused Assessment - Including review of records relating to presenting problem    Occupational performance deficits (Please refer to evaluation for details): ADL's;IADL's;Rest and Sleep;Play;Leisure;Social Participation    Body Structure / Function / Physical Skills ADL;Strength;Decreased knowledge of use of DME;Pain;UE functional use;ROM;IADL;Flexibility;FMC    Clinical Decision Making Limited treatment options, no task modification necessary    Comorbidities Affecting Occupational Performance: None    Modification or Assistance to Complete Evaluation  No modification of tasks or assist necessary to complete eval    OT Frequency 2x / week    OT Treatment/Interventions Self-care/ADL training;Cryotherapy;Iontophoresis;Ultrasound;Therapeutic exercise;Contrast Bath;Splinting;DME and/or AE instruction;Passive range of motion;Manual Therapy    Consulted and Agree with Plan of Care Patient           Patient will benefit from skilled therapeutic intervention in order to improve the following deficits and impairments:   Body Structure / Function / Physical Skills: ADL,Strength,Decreased knowledge of use of DME,Pain,UE functional use,ROM,IADL,Flexibility,FMC       Visit Diagnosis: Trigger thumb of left hand  Pain in left hand  Stiffness of left hand, not elsewhere classified    Problem List There are no problems to display for this patient.   Oletta Cohn OTR/L,CLT 10/11/2020, 9:38 AM  [facility] [address] [place], [address] Phone: [phone]   Fax:  [phone]  Name: Tiffany Wilcox MRN: [id number] Date of Birth: [birth year]

## 2020-10-15 ENCOUNTER — Ambulatory Visit: Payer: Medicare HMO | Admitting: Occupational Therapy

## 2020-10-15 ENCOUNTER — Encounter: Payer: Self-pay | Admitting: Physical Therapy

## 2020-10-15 ENCOUNTER — Other Ambulatory Visit: Payer: Self-pay

## 2020-10-15 ENCOUNTER — Ambulatory Visit: Payer: Medicare HMO | Admitting: Physical Therapy

## 2020-10-15 DIAGNOSIS — M79642 Pain in left hand: Secondary | ICD-10-CM

## 2020-10-15 DIAGNOSIS — G8929 Other chronic pain: Secondary | ICD-10-CM | POA: Diagnosis not present

## 2020-10-15 DIAGNOSIS — M25512 Pain in left shoulder: Secondary | ICD-10-CM

## 2020-10-15 DIAGNOSIS — M25642 Stiffness of left hand, not elsewhere classified: Secondary | ICD-10-CM

## 2020-10-15 DIAGNOSIS — M65312 Trigger thumb, left thumb: Secondary | ICD-10-CM | POA: Diagnosis not present

## 2020-10-15 NOTE — Therapy (Signed)
[facility] [address], [place], [address] Phone: [phone]   Fax:  [phone]  Occupational Therapy Treatment  Patient Details  Name: Tiffany Wilcox MRN: [id number] Date of Birth: 1950-07-16 Referring Provider (OT): Dr Lyn Hollingshead   Encounter Date: 10/15/2020   OT End of Session - 10/15/20 1652    Visit Number 3    Number of Visits 12    Date for OT Re-Evaluation 11/20/20    OT Start Time 1545    OT Stop Time 1630    OT Time Calculation (min) 45 min    Activity Tolerance Patient tolerated treatment well    Behavior During Therapy [facility] for tasks assessed/performed           No past medical history on file.  No past surgical history on file.  There were no vitals filed for this visit.   Subjective Assessment - 10/15/20 1622    Subjective  Little better - I still do bandaid at night time to remind me and when in garden    Pertinent History L trigger thumb started about 2 months ago - her PCP refer her to DR Lyn Hollingshead -pt decline shot and refer to OT for L trigger thumb    Patient Stated Goals Want my L thumb pain and motion better so I can crochet , work in the yard and use it like before    Currently in Pain? Yes    Pain Score 4    tender A1pulley of thumb   Pain Descriptors / Indicators Tender    Pain Type Acute pain    Pain Onset More than a month ago                    Skin check done prior - no issues - tolerated well - pt to keep on for hour afterwards    OT Treatments/Exercises (OP) - 10/15/20 0001      Iontophoresis   Type of Iontophoresis Dexamethasone    Location L thumb A1pulley    Dose med patch , 2 current    Time 19           Pt report doing contrast - 2 x day - feels so much better afterwards AROM pain free for PA and RA  And opposition - picking up 2 cm object without pain and triggering  Can do opposition touching each digit without pain this  date-  Ice massage several times during day over A1pulley at thumb  And modify grip - use palms or larger joint, avoid lat grip or pinch and enlarge grips - for gardening  Can use night time bandaid on thumb IP to decrease locking and but not during day anymore         OT Education - 10/15/20 1651    Education Details Ionto use and HEP    Person(s) Educated Patient    Methods Explanation;Verbal cues;Tactile cues;Demonstration;Handout    Comprehension Verbal cues required;Returned demonstration;Verbalized understanding            OT Short Term Goals - 10/09/20 1303      OT SHORT TERM GOAL #1   Title Pt to be indepedent in HEP to decrease pain, tendernes and triggering in L thumb    Baseline pain 4-7/10 and night time can increase to 10/10 , and tender - decrease AROM IP 60 and opposition - triggering several times at night time    Time 3  Period Weeks    Status New    Target Date 10/30/20             OT Long Term Goals - 10/09/20 1304      OT LONG TERM GOAL #1   Title L thumb AROM improve to WNL to use in ADL's without triggering    Baseline decrease RA and pain with flexion of thumb and opposition .Triggering worse nigh time    Time 5    Target Date 11/13/20      OT LONG TERM GOAL #2   Title Pt be independent in modifications to decrease risk for triggering and return to prior level of function    Baseline no knowledge on modifications and tasks that increase her risk for trigger thumb    Time 6    Period Weeks    Status New    Target Date 11/20/20                 Plan - 10/15/20 1652    Clinical Impression Statement Pt refer to OT with L non dominant trigger thumb. Pt pain at eval in thumb from 4-7/10 , tenderness over A1pulley 10/10 and at night time. Pt  cont to show increase stiffness in AROM and decrease strength using L thumb. Pt ed on modfications to tasks again this date and  immobilization to IP at night time and to decrease pain and  increase AROM withtout triggering and pain - 3rd session of ionto done this date - tolerated well - pt limited in use of L hand in ADlL's and IADL's    OT Occupational Profile and History Problem Focused Assessment - Including review of records relating to presenting problem    Occupational performance deficits (Please refer to evaluation for details): ADL's;IADL's;Rest and Sleep;Play;Leisure;Social Participation    Body Structure / Function / Physical Skills ADL;Strength;Decreased knowledge of use of DME;Pain;UE functional use;ROM;IADL;Flexibility;FMC    Rehab Potential Fair    Clinical Decision Making Limited treatment options, no task modification necessary    Comorbidities Affecting Occupational Performance: None    Modification or Assistance to Complete Evaluation  No modification of tasks or assist necessary to complete eval    OT Frequency 2x / week    OT Duration 6 weeks    OT Treatment/Interventions Self-care/ADL training;Cryotherapy;Iontophoresis;Ultrasound;Therapeutic exercise;Contrast Bath;Splinting;DME and/or AE instruction;Passive range of motion;Manual Therapy    Consulted and Agree with Plan of Care Patient           Patient will benefit from skilled therapeutic intervention in order to improve the following deficits and impairments:   Body Structure / Function / Physical Skills: ADL,Strength,Decreased knowledge of use of DME,Pain,UE functional use,ROM,IADL,Flexibility,FMC       Visit Diagnosis: Trigger thumb of left hand  Pain in left hand  Stiffness of left hand, not elsewhere classified    Problem List There are no problems to display for this patient.   Oletta Cohn OTR/L,CLT 10/15/2020, 4:58 PM  [place] [facility] [address] [place], [address] Phone: [phone]   Fax:  [phone]  Name: Tiffany Wilcox MRN: [id number] Date of Birth: [birth year]

## 2020-10-15 NOTE — Therapy (Signed)
[facility] [address] [place], [address] Phone: [phone]   Fax:  [phone]  Physical Therapy Treatment  Patient Details  Name: Tiffany Wilcox MRN: [id number] Date of Birth: 07/18/1950 No data recorded  Encounter Date: 10/15/2020   PT End of Session - 10/15/20 1554    Visit Number 2    Number of Visits 17    Date for PT Re-Evaluation 12/07/20    PT Start Time 7673    PT Stop Time 1555    PT Time Calculation (min) 40 min    Activity Tolerance Patient tolerated treatment well    Behavior During Therapy [facility] for tasks assessed/performed           History reviewed. No pertinent past medical history.  History reviewed. No pertinent surgical history.  There were no vitals filed for this visit.   Subjective Assessment - 10/15/20 1518    Subjective Pt reports she has been completing her HEP and thinks her motion is getting a little better. Is having 7/10 pain today. Nothing else new to note.    Pertinent History Pt is a 70 year old female presenting with L shoulder pain since Feb 2022. 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 Reports no MOI, just that she started noticing that her shoulder hurt 10/10 pain with overhead reaching. She reports her pain is a C-shape cap over her shoulder, and some on the posterior superior shoulder. She has had ultrasound imaging in April revealing "small RTC tear". Worst pain over the past couple weeks 8/10; best 3/10. Her pain is achy most of the time, but when she lifts her arm she has a sharp and burning. Denies popping/clicking or tingling/numbess. Pain is aggravated by laying on either side, overhead reaching, overhead lifting. Denies pain with pushing and pulling. She is retired, works part time as a Armed forces training and education officer, her job duties does not bother her shoulder, but she is mindful when she carries files. Enjoys gardening and crocheting and has been having  trouble with this since her pain has started. She was completing [place] until Oct of 2021, during a grieving period, and tried to start again in Feb 2022, but her shoulder was too painful. She does report walking multiple times per week. She would like to return Tai Chi 3x/week and swimming 1x/week. Pt denies N/V, B&B changes, unexplained weight fluctuation, saddle paresthesia, fever, night sweats, or unrelenting night pain at this time. No falls in past 86mo.    Limitations Lifting;House hold activities    How long can you sit comfortably? unlimited    How long can you stand comfortably? unlimited    How long can you walk comfortably? unlimited    Diagnostic tests dianostic [facility] April 2022    Patient Stated Goals return to exercises    Pain Onset More than a month ago              Ther-Ex Pulleys flex/abd x15 with 2 sec hold with good carry over of HEP Y on wall x12 with TC needed for scapula depression + retraction with decent carry over Scapular retraction at wall for TC prior to: bilat ER RTB  x12 with cuing for scapulohumeral rhythm with success UT stretch x30sec hold Levator stretch x30sec hold Doorway pec stretch x30sec hold   Manual STM UT, levator scapulae and pec minor with noted trigger points GHJ G3 AP mob w/ abd, ER, IR 4x 30sec each direction; inf  G3 mob flex 4x 30sec bouts                          PT Short Term Goals - 10/09/20 1311      PT SHORT TERM GOAL #1   Title Pt will be independent with HEP in order to improve strength and decrease pain in order to improve pain-free function at home and work.    Baseline 10/09/20 HEP given    Time 4    Period Weeks    Status New             PT Long Term Goals - 10/09/20 1312      PT LONG TERM GOAL #1   Title Pt will decrease worst pain as reported on NPRS by at least 3 points in order to demonstrate clinically significant reduction in pain.    Baseline 10/09/20 8/10    Time 8    Period Weeks     Status New      PT LONG TERM GOAL #2   Title Pt will demonstrate full L active shoulder ROM in order to complete household ADLs and self care to reflect PLOF    Baseline 10/09/20 flex: 80d abd: 72d ER: C5 IR: S1    Time 8    Period Weeks    Status New      PT LONG TERM GOAL #3   Title Patient will demonstrate all shoulder MMTs to at least 4+/5 in order to complete heavy household ADLs and return to PLOF    Baseline 10/09/20 flex: 2+ abd: 2+ ER: 4  IR: 4+    Time 8    Status New      PT LONG TERM GOAL #4   Title Patient will increase FOTO score to 67 to demonstrate predicted increase in functional mobility to complete ADLs    Baseline 10/09/20 50    Time 8    Period Weeks    Status New                 Plan - 10/15/20 1606    Clinical Impression Statement PT initiated therex progresison for increased mobility and scapulohumeral rhythm with success. PT utilized manual techniques to decrease muscle tension of compensatory musculature and pain with success. PT reviewed HEP with patient and updated HEP, where patient is able to verbalize and demonstrate understanding of all HEP exercises. PT willc otninue progression as able.    Personal Factors and Comorbidities Age;Sex;Fitness;Past/Current Experience;Time since onset of injury/illness/exacerbation    Examination-Activity Limitations Bathing;Reach Overhead;Lift;Carry;Sleep;Bed Mobility    Examination-Participation Restrictions Driving;Yard Work;Meal Prep;Cleaning;Laundry;Community Activity    Stability/Clinical Decision Making Evolving/Moderate complexity    Clinical Decision Making Moderate    Rehab Potential Good    PT Frequency 2x / week    PT Duration 8 weeks    PT Treatment/Interventions ADLs/Self Care Home Management;Iontophoresis 82m/ml Dexamethasone;Therapeutic exercise;Passive range of motion;Joint Manipulations;Spinal Manipulations;Dry needling;Manual techniques;Neuromuscular re-education;Therapeutic activities;Moist  Heat;Electrical Stimulation;Cryotherapy;Ultrasound;DME Instruction;Functional mobility training;Balance training;Patient/family education    PT Next Visit Plan Y/T/I MMT, TDN?    PT Home Exercise Plan pulleys and UT stretch    Consulted and Agree with Plan of Care Patient           Patient will benefit from skilled therapeutic intervention in order to improve the following deficits and impairments:  Decreased coordination,Decreased endurance,Decreased range of motion,Decreased strength,Decreased mobility,Hypomobility,Increased fascial restricitons,Impaired UE functional use,Improper body mechanics,Pain,Postural dysfunction,Impaired flexibility,Increased muscle spasms  Visit Diagnosis:  Chronic left shoulder pain     Problem List There are no problems to display for this patient.  Durwin Reges DPT Durwin Reges 10/15/2020, 4:17 PM  [facility] [address] [place], [address] Phone: [phone]   Fax:  [phone]  Name: Tiffany Wilcox MRN: [id number] Date of Birth: December 01, 1950

## 2020-10-17 ENCOUNTER — Other Ambulatory Visit: Payer: Self-pay

## 2020-10-17 ENCOUNTER — Encounter: Payer: Self-pay | Admitting: Physical Therapy

## 2020-10-17 ENCOUNTER — Ambulatory Visit: Payer: Medicare HMO | Admitting: Physical Therapy

## 2020-10-17 DIAGNOSIS — G8929 Other chronic pain: Secondary | ICD-10-CM | POA: Diagnosis not present

## 2020-10-17 DIAGNOSIS — M65312 Trigger thumb, left thumb: Secondary | ICD-10-CM

## 2020-10-17 DIAGNOSIS — M25512 Pain in left shoulder: Secondary | ICD-10-CM | POA: Diagnosis not present

## 2020-10-17 DIAGNOSIS — M79642 Pain in left hand: Secondary | ICD-10-CM | POA: Diagnosis not present

## 2020-10-17 DIAGNOSIS — M25642 Stiffness of left hand, not elsewhere classified: Secondary | ICD-10-CM | POA: Diagnosis not present

## 2020-10-17 NOTE — Therapy (Signed)
[facility] [address] [place], [address] Phone: [phone]   Fax:  [phone]  Physical Therapy Treatment  Patient Details  Name: Tiffany Wilcox MRN: [id number] Date of Birth: 12/01/1950 No data recorded  Encounter Date: 10/17/2020   PT End of Session - 10/17/20 1537    Visit Number 3    Number of Visits 17    Date for PT Re-Evaluation 12/07/20    PT Start Time 4650    PT Stop Time 1555    PT Time Calculation (min) 40 min    Activity Tolerance Patient tolerated treatment well    Behavior During Therapy [facility] for tasks assessed/performed           History reviewed. No pertinent past medical history.  History reviewed. No pertinent surgical history.  There were no vitals filed for this visit.   Subjective Assessment - 10/17/20 1516    Subjective Pt reports she has been completing HEP and doing well overall. Has thought about TDN, and wants to discuss it with her daughter first. Was sore following last session, reports 3/10 pain today.    Pertinent History Pt is a 70 year old female presenting with L shoulder pain since Feb 2022. 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 Reports no MOI, just that she started noticing that her shoulder hurt 10/10 pain with overhead reaching. She reports her pain is a C-shape cap over her shoulder, and some on the posterior superior shoulder. She has had ultrasound imaging in April revealing "small RTC tear". Worst pain over the past couple weeks 8/10; best 3/10. Her pain is achy most of the time, but when she lifts her arm she has a sharp and burning. Denies popping/clicking or tingling/numbess. Pain is aggravated by laying on either side, overhead reaching, overhead lifting. Denies pain with pushing and pulling. She is retired, works part time as a Armed forces training and education officer, her job duties does not bother her shoulder, but she is mindful when she carries files. Enjoys  gardening and crocheting and has been having trouble with this since her pain has started. She was completing [place] until Oct of 2021, during a grieving period, and tried to start again in Feb 2022, but her shoulder was too painful. She does report walking multiple times per week. She would like to return Tai Chi 3x/week and swimming 1x/week. Pt denies N/V, B&B changes, unexplained weight fluctuation, saddle paresthesia, fever, night sweats, or unrelenting night pain at this time. No falls in past 84mo.    Limitations Lifting;House hold activities    How long can you sit comfortably? unlimited    How long can you stand comfortably? unlimited    How long can you walk comfortably? unlimited    Diagnostic tests dianostic [facility] April 2022    Patient Stated Goals return to exercises    Pain Onset More than a month ago            Ther-Ex Pulleys flex/abd x15 with 2 sec hold with good carry over of HEP Supine scapular punches x10; with 2# DB  Y on wall x12 with TC needed for scapula depression + retraction with decent carry over Attempted forearm slides patient with severe difficulty after x6 reps Tband pull aparts GTB 2x 15 with good carry over of demo and initial cuing for proper technique TRX rows 2x 10 with cuing for initial scapular retraction and thumb ext per OT Latissimus stretch x30sec hold Doorway  pec stretch x30sec hold   Manual STM UT, levator scapulae and pec minor with noted trigger points GHJ G3 AP mob w/  ER, IR 4x 30sec each direction; inf G3 mob flex 4x 30sec bouts               PT Education - 10/17/20 1536    Education Details therex form/technique    Person(s) Educated Patient    Methods Explanation;Demonstration;Verbal cues    Comprehension Verbalized understanding;Returned demonstration;Verbal cues required            PT Short Term Goals - 10/09/20 1311      PT SHORT TERM GOAL #1   Title Pt will be independent with HEP in order to improve strength  and decrease pain in order to improve pain-free function at home and work.    Baseline 10/09/20 HEP given    Time 4    Period Weeks    Status New             PT Long Term Goals - 10/09/20 1312      PT LONG TERM GOAL #1   Title Pt will decrease worst pain as reported on NPRS by at least 3 points in order to demonstrate clinically significant reduction in pain.    Baseline 10/09/20 8/10    Time 8    Period Weeks    Status New      PT LONG TERM GOAL #2   Title Pt will demonstrate full L active shoulder ROM in order to complete household ADLs and self care to reflect PLOF    Baseline 10/09/20 flex: 80d abd: 72d ER: C5 IR: S1    Time 8    Period Weeks    Status New      PT LONG TERM GOAL #3   Title Patient will demonstrate all shoulder MMTs to at least 4+/5 in order to complete heavy household ADLs and return to PLOF    Baseline 10/09/20 flex: 2+ abd: 2+ ER: 4  IR: 4+    Time 8    Status New      PT LONG TERM GOAL #4   Title Patient will increase FOTO score to 67 to demonstrate predicted increase in functional mobility to complete ADLs    Baseline 10/09/20 50    Time 8    Period Weeks    Status New                 Plan - 10/17/20 1547    Clinical Impression Statement PT continued therex progression for increased mobility and scapulohumeral rhythm with success. Patien twith less tone in UT and levator this session, but still present with pec minor and latissimus tension as well. Patinet iwth better carry over of multimodal cuing for technique of therex with scapular retraction without shoulder hiking, and for scapulohumeral rhythm with success. Patient is motivated without increased pain throughout session. PT will continue progression as able.    Personal Factors and Comorbidities Age;Sex;Fitness;Past/Current Experience;Time since onset of injury/illness/exacerbation    Examination-Activity Limitations Bathing;Reach Overhead;Lift;Carry;Sleep;Bed Mobility     Examination-Participation Restrictions Driving;Yard Work;Meal Prep;Cleaning;Laundry;Community Activity    Stability/Clinical Decision Making Evolving/Moderate complexity    Clinical Decision Making Moderate    Rehab Potential Good    PT Frequency 2x / week    PT Duration 8 weeks    PT Treatment/Interventions ADLs/Self Care Home Management;Iontophoresis 14m/ml Dexamethasone;Therapeutic exercise;Passive range of motion;Joint Manipulations;Spinal Manipulations;Dry needling;Manual techniques;Neuromuscular re-education;Therapeutic activities;Moist Heat;Electrical Stimulation;Cryotherapy;Ultrasound;DME Instruction;Functional mobility training;Balance training;Patient/family education  PT Next Visit Plan Y/T/I MMT, TDN?    PT Home Exercise Plan pulleys and UT stretch    Consulted and Agree with Plan of Care Patient           Patient will benefit from skilled therapeutic intervention in order to improve the following deficits and impairments:  Decreased coordination,Decreased endurance,Decreased range of motion,Decreased strength,Decreased mobility,Hypomobility,Increased fascial restricitons,Impaired UE functional use,Improper body mechanics,Pain,Postural dysfunction,Impaired flexibility,Increased muscle spasms  Visit Diagnosis: Trigger thumb of left hand     Problem List There are no problems to display for this patient.  Durwin Reges DPT Durwin Reges 10/17/2020, 3:53 PM  [facility] [address], [place], [address] Phone: [phone]   Fax:  [phone]  Name: Tiffany Wilcox MRN: [id number] Date of Birth: October 28, 1950

## 2020-10-18 ENCOUNTER — Ambulatory Visit: Payer: Medicare HMO | Admitting: Occupational Therapy

## 2020-10-18 DIAGNOSIS — M25642 Stiffness of left hand, not elsewhere classified: Secondary | ICD-10-CM | POA: Diagnosis not present

## 2020-10-18 DIAGNOSIS — M65312 Trigger thumb, left thumb: Secondary | ICD-10-CM | POA: Diagnosis not present

## 2020-10-18 DIAGNOSIS — G8929 Other chronic pain: Secondary | ICD-10-CM | POA: Diagnosis not present

## 2020-10-18 DIAGNOSIS — M79642 Pain in left hand: Secondary | ICD-10-CM

## 2020-10-18 DIAGNOSIS — M25512 Pain in left shoulder: Secondary | ICD-10-CM | POA: Diagnosis not present

## 2020-10-18 NOTE — Therapy (Signed)
[facility] [address], [place], [address] Phone: [phone]   Fax:  [phone]  Occupational Therapy Treatment  Patient Details  Name: Iridessa Harrow MRN: [id number] Date of Birth: 1950/07/01 Referring Provider (OT): Dr Lyn Hollingshead   Encounter Date: 10/18/2020   OT End of Session - 10/18/20 1522     Visit Number 4    Number of Visits 12    Date for OT Re-Evaluation 11/20/20    OT Start Time 1523    OT Stop Time 1620    OT Time Calculation (min) 57 min    Activity Tolerance Patient tolerated treatment well    Behavior During Therapy [facility] for tasks assessed/performed             No past medical history on file.  No past surgical history on file.  There were no vitals filed for this visit.   Subjective Assessment - 10/18/20 1522     Subjective  Doing better- my thumb clicking less than 5 x day and pain better - look I can touch all fingers and slide down without pain    Pertinent History L trigger thumb started about 2 months ago - her PCP refer her to DR Lyn Hollingshead -pt decline shot and refer to OT for L trigger thumb    Patient Stated Goals Want my L thumb pain and motion better so I can crochet , work in the yard and use it like before    Currently in Pain? Yes    Pain Score 4    Tender over A1pulley   Pain Location Finger (Comment which one)    Pain Orientation Left    Pain Descriptors / Indicators Tender    Pain Type Acute pain    Pain Onset More than a month ago    Pain Frequency Intermittent                [facility] OT Assessment - 10/18/20 0001       Left Hand AROM   L Thumb MCP 0-60 35 Degrees    L Thumb IP 0-80 70 Degrees    L Thumb Radial ADduction/ABduction 0-55 60    L Thumb Palmar ADduction/ABduction 0-45 70               Pt show increase composite flexion of thumb and opposition - with less locking or clicking and pain       OT Treatments/Exercises (OP) -  10/18/20 0001       Iontophoresis   Type of Iontophoresis Dexamethasone    Location L thumb A1pulley    Dose med patch , 2 current    Time 19      LUE Contrast Bath   Time 8 minutes    Comments prior to soft tissue and ROM             Review again with HEP - contrast - 2 x day AROM pain free for PA and RA And opposition - to all digits and slide down 5th - If no pain and clicking  Cont Ice massage several times during day over A1pulley at thumb And modify grip - use palms or larger joint, avoid lat grip or pinch and enlarge grips - for gardening   Can use night time bandaid on thumb IP to decrease locking and but not during day anymore   Skin check done - no issues tolerating ionto - and pt to keep patch on  for hour afterwards          OT Education - 10/18/20 1522     Education Details Ionto use and HEP    Person(s) Educated Patient    Methods Explanation;Verbal cues;Tactile cues;Demonstration;Handout    Comprehension Verbal cues required;Returned demonstration;Verbalized understanding              OT Short Term Goals - 10/09/20 1303       OT SHORT TERM GOAL #1   Title Pt to be indepedent in HEP to decrease pain, tendernes and triggering in L thumb    Baseline pain 4-7/10 and night time can increase to 10/10 , and tender - decrease AROM IP 60 and opposition - triggering several times at night time    Time 3    Period Weeks    Status New    Target Date 10/30/20               OT Long Term Goals - 10/09/20 1304       OT LONG TERM GOAL #1   Title L thumb AROM improve to WNL to use in ADL's without triggering    Baseline decrease RA and pain with flexion of thumb and opposition .Triggering worse nigh time    Time 5    Target Date 11/13/20      OT LONG TERM GOAL #2   Title Pt be independent in modifications to decrease risk for triggering and return to prior level of function    Baseline no knowledge on modifications and tasks that increase her  risk for trigger thumb    Time 6    Period Weeks    Status New    Target Date 11/20/20                   Plan - 10/18/20 1523     Clinical Impression Statement Pt refer to OT with L non dominant trigger thumb. Pt pain at eval in thumb from 4-7/10 , tenderness over A1pulley 10/10 and at night time. Pt  show increase flexion in thumb and opposition to 2nd fold of 5th wtih sligth pull but no clicking - less than 5 x locking during day and pain better with use. Pt to cont use  modfications in tasks and immobilization to IP at night time and to decrease pain and increase AROM withtout triggering and pain - 4th session of ionto done this date - tolerated well - pt limited in use of L hand in ADlL's and IADL's    OT Occupational Profile and History Problem Focused Assessment - Including review of records relating to presenting problem    Occupational performance deficits (Please refer to evaluation for details): ADL's;IADL's;Rest and Sleep;Play;Leisure;Social Participation    Body Structure / Function / Physical Skills ADL;Strength;Decreased knowledge of use of DME;Pain;UE functional use;ROM;IADL;Flexibility;FMC    Clinical Decision Making Limited treatment options, no task modification necessary    Comorbidities Affecting Occupational Performance: None    Modification or Assistance to Complete Evaluation  No modification of tasks or assist necessary to complete eval    OT Frequency 2x / week    OT Duration 6 weeks    OT Treatment/Interventions Self-care/ADL training;Cryotherapy;Iontophoresis;Ultrasound;Therapeutic exercise;Contrast Bath;Splinting;DME and/or AE instruction;Passive range of motion;Manual Therapy    Consulted and Agree with Plan of Care Patient             Patient will benefit from skilled therapeutic intervention in order to improve the following deficits and impairments:   Body  Structure / Function / Physical Skills: ADL, Strength, Decreased knowledge of use of DME,  Pain, UE functional use, ROM, IADL, Flexibility, FMC       Visit Diagnosis: Trigger thumb of left hand  Pain in left hand  Stiffness of left hand, not elsewhere classified    Problem List There are no problems to display for this patient.   Oletta Cohn OTR/L,CLT 10/18/2020, 4:17 PM  [facility] [address], [place], [address] Phone: [phone]   Fax:  [phone]  Name: Annalei Friesz MRN: [id number] Date of Birth: 1950/05/15

## 2020-10-22 ENCOUNTER — Ambulatory Visit: Payer: Medicare HMO | Admitting: Occupational Therapy

## 2020-10-22 ENCOUNTER — Encounter: Payer: Self-pay | Admitting: Physical Therapy

## 2020-10-22 ENCOUNTER — Ambulatory Visit: Payer: Medicare HMO | Admitting: Physical Therapy

## 2020-10-22 ENCOUNTER — Other Ambulatory Visit: Payer: Self-pay

## 2020-10-22 DIAGNOSIS — M25642 Stiffness of left hand, not elsewhere classified: Secondary | ICD-10-CM

## 2020-10-22 DIAGNOSIS — G8929 Other chronic pain: Secondary | ICD-10-CM

## 2020-10-22 DIAGNOSIS — M25512 Pain in left shoulder: Secondary | ICD-10-CM | POA: Diagnosis not present

## 2020-10-22 DIAGNOSIS — M79642 Pain in left hand: Secondary | ICD-10-CM | POA: Diagnosis not present

## 2020-10-22 DIAGNOSIS — M65312 Trigger thumb, left thumb: Secondary | ICD-10-CM

## 2020-10-22 NOTE — Therapy (Signed)
[facility] PHYSICAL AND SPORTS MEDICINE [address], [place], [address] Phone: [phone]   Fax:  [phone]  Occupational Therapy Treatment  Patient Details  Name: Tiffany Wilcox MRN: [id number] Date of Birth: November 29, 1950 Referring Provider (OT): Dr Lyn Hollingshead   Encounter Date: 10/22/2020   OT End of Session - 10/22/20 1620     Visit Number 5    Number of Visits 12    Date for OT Re-Evaluation 11/20/20    OT Start Time 1604    OT Stop Time 1642    OT Time Calculation (min) 38 min    Activity Tolerance Patient tolerated treatment well    Behavior During Therapy [facility] for tasks assessed/performed             No past medical history on file.  No past surgical history on file.  There were no vitals filed for this visit.   Subjective Assessment - 10/22/20 1616     Subjective  No clcking over the weekend - but still sleeping with bandaid night time -and then keeping thumb to side when gripping objects    Pertinent History L trigger thumb started about 2 months ago - her PCP refer her to DR Lyn Hollingshead -pt decline shot and refer to OT for L trigger thumb    Patient Stated Goals Want my L thumb pain and motion better so I can crochet , work in the yard and use it like before    Currently in Pain? Yes    Pain Score 3     Pain Location Finger (Comment which one)    Pain Orientation Left    Pain Descriptors / Indicators Tender    Pain Type Acute pain    Pain Onset More than a month ago    Pain Frequency Intermittent                [facility] OT Assessment - 10/22/20 0001       Left Hand AROM   L Thumb MCP 0-60 35 Degrees    L Thumb IP 0-80 70 Degrees    L Thumb Radial ADduction/ABduction 0-55 60    L Thumb Palmar ADduction/ABduction 0-45 70    L Thumb Opposition to Index --   opposition base of 5th , no locking              Pt show increase composite flexion of thumb and opposition - with less locking or clicking  and pain Some tenderness this date over 1st dorsal compartment - pt compensate with thumb to radial side of hand and into extention when gripping objects Pt ed on gripping object but enlarge grips, loose grip and cup thumb just lightly around the hand            Skin check done - no issues tolerating ionto - and pt to keep patch on for hour afterwards      OT Treatments/Exercises (OP) - 10/18/20 0001                Iontophoresis    Type of Iontophoresis Dexamethasone    Location L thumb A1pulley    Dose med patch , 2 current    Time 19         LUE Contrast Bath    Time 8 minutes    Comments prior to soft tissue and ROM                   Review  again with HEP - contrast - 2 x day AROM pain free for PA and RA And opposition - to all digits and slide down 5th - no pain and clicking Cont Ice massage several times during day over A1pulley at thumb And modify grip - use palms or larger joint, avoid lat grip or pinch and enlarge grips - for gardening   Can use night time bandaid on thumb IP to decrease locking and but not during day anymore                  OT Education - 10/18/20 1522       Education Details Ionto use and HEP    Person(s) Educated            Review again with HEP - contrast - 2 x day AROM pain free for PA and RA And opposition - to all digits and slide down 5th - If no pain and clicking Cont Ice massage several times during day over A1pulley at thumb And modify grip - use palms or larger joint, avoid lat grip or pinch and enlarge grips - for gardening   Can use night time bandaid on thumb IP to decrease locking and but not during day anymore   Skin check done - no issues tolerating ionto - and pt to keep patch on for hour afterwards                OT Education - 10/18/20 1522       Education Details Ionto use and HEP    Person(s) Educated              OT Treatments/Exercises (OP) - 10/22/20 0001       Iontophoresis   Type  of Iontophoresis Dexamethasone    Location L thumb A1pulley    Dose med patch , 2 current    Time 19      LUE Contrast Bath   Time 8 minutes    Comments prior to soft tissue                    OT Education - 10/22/20 1620     Education Details Ionto use and HEP    Person(s) Educated Patient    Methods Explanation;Verbal cues;Tactile cues;Demonstration;Handout    Comprehension Verbal cues required;Returned demonstration;Verbalized understanding              OT Short Term Goals - 10/09/20 1303       OT SHORT TERM GOAL #1   Title Pt to be indepedent in HEP to decrease pain, tendernes and triggering in L thumb    Baseline pain 4-7/10 and night time can increase to 10/10 , and tender - decrease AROM IP 60 and opposition - triggering several times at night time    Time 3    Period Weeks    Status New    Target Date 10/30/20               OT Long Term Goals - 10/09/20 1304       OT LONG TERM GOAL #1   Title L thumb AROM improve to WNL to use in ADL's without triggering    Baseline decrease RA and pain with flexion of thumb and opposition .Triggering worse nigh time    Time 5    Target Date 11/13/20      OT LONG TERM GOAL #2   Title Pt be independent in modifications to decrease risk for  triggering and return to prior level of function    Baseline no knowledge on modifications and tasks that increase her risk for trigger thumb    Time 6    Period Weeks    Status New    Target Date 11/20/20                   Plan - 10/22/20 1621     Clinical Impression Statement Pt refer to OT with L non dominant trigger thumb. Pt pain at eval in thumb from 4-7/10 , tenderness over A1pulley 10/10 and at night time. Pt  show increase flexion in thumb and opposition to 2nd fold of 5th wtihless of pull and no clicking over the weekend - did had some tenderness over the 1st dorsal compartment this date - pt keeping thumb out to side and extention when gripping  objects and bands in PT - pt ed on start to grip objects but enlarge them and light flexoin around the digits with thumb. Pt to cont use  modfications in tasks and immobilization to IP at night time and to decrease pain and increase AROM withtout triggering and pain - 5th session of ionto done this date - tolerated well - pt limited in use of L hand in ADlL's and IADL's    OT Occupational Profile and History Problem Focused Assessment - Including review of records relating to presenting problem    Occupational performance deficits (Please refer to evaluation for details): ADL's;IADL's;Rest and Sleep;Play;Leisure;Social Participation    Body Structure / Function / Physical Skills ADL;Strength;Decreased knowledge of use of DME;Pain;UE functional use;ROM;IADL;Flexibility;FMC    Rehab Potential Fair    Clinical Decision Making Limited treatment options, no task modification necessary    Comorbidities Affecting Occupational Performance: None    Modification or Assistance to Complete Evaluation  No modification of tasks or assist necessary to complete eval    OT Frequency 2x / week    OT Duration 6 weeks    OT Treatment/Interventions Self-care/ADL training;Cryotherapy;Iontophoresis;Ultrasound;Therapeutic exercise;Contrast Bath;Splinting;DME and/or AE instruction;Passive range of motion;Manual Therapy    Consulted and Agree with Plan of Care Patient             Patient will benefit from skilled therapeutic intervention in order to improve the following deficits and impairments:   Body Structure / Function / Physical Skills: ADL, Strength, Decreased knowledge of use of DME, Pain, UE functional use, ROM, IADL, Flexibility, [facility]       Visit Diagnosis: Trigger thumb of left hand  Stiffness of left hand, not elsewhere classified  Pain in left hand    Problem List There are no problems to display for this patient.   Oletta Cohn OTR/L,CLT 10/22/2020, 6:06 PM  [facility]  [facility] PHYSICAL AND SPORTS MEDICINE [address] [place], [address] Phone: [phone]   Fax:  [phone]  Name: Tiffany Wilcox MRN: [id number] Date of Birth: December 02, 1950

## 2020-10-22 NOTE — Therapy (Signed)
[place] PH[facility]ICAL AND SPORTS MEDICINE [address], [place], [address] Phone: [phone]   Fax:  [phone]  Physical Therapy Treatment  Patient Details  Name: Tiffany Wilcox MRN: [id number] Date of Birth: January 04, 1951 No data recorded  Encounter Date: 10/22/2020   PT End of Session - 10/22/20 1606     Visit Number 4    Number of Visits 17    Date for PT Re-Evaluation 12/07/20    PT Start Time 0160    PT Stop Time 1608    PT Time Calculation (min) 38 min    Activity Tolerance Patient tolerated treatment well    Behavior During Therapy [facility] for tasks assessed/performed             History reviewed. No pertinent past medical history.  History reviewed. No pertinent surgical history.  There were no vitals filed for this visit.   Subjective Assessment - 10/22/20 1613     Subjective Pt reports active participation in HEP and continues to notice progress with physical therapy. Pt expressed williness to try DN next visit for L upper trapezius. Pt noted that she does continue to experience L shoulder pain and rates it 3/10 today.    Pertinent History Pt is a 70 year old female presenting with L shoulder pain since Feb 2022. 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 Reports no MOI, just that she started noticing that her shoulder hurt 10/10 pain with overhead reaching. She reports her pain is a C-shape cap over her shoulder, and some on the posterior superior shoulder. She has had ultrasound imaging in April revealing "small RTC tear". Worst pain over the past couple weeks 8/10; best 3/10. Her pain is achy most of the time, but when she lifts her arm she has a sharp and burning. Denies popping/clicking or tingling/numbess. Pain is aggravated by laying on either side, overhead reaching, overhead lifting. Denies pain with pushing and pulling. She is retired, works part time as a Armed forces training and education officer, her job duties does not bother  her shoulder, but she is mindful when she carries files. Enjoys gardening and crocheting and has been having trouble with this since her pain has started. She was completing [place] until Oct of 2021, during a grieving period, and tried to start again in Feb 2022, but her shoulder was too painful. She does report walking multiple times per week. She would like to return Tai Chi 3x/week and swimming 1x/week. Pt denies N/V, B&B changes, unexplained weight fluctuation, saddle paresthesia, fever, night sweats, or unrelenting night pain at this time. No falls in past 67mos.    Limitations Lifting;House hold activities              Manual Therapy  Muscle Energy technique: ER/IR cueing to push into PT hand and meet resistance  Subscapularis pin & stretch  Therapeutic Exercise  Pulleys: Flex, Scaption, Abd 1 x 90 sec each  Serratus punches 2 x 12 2# DB; 1 x 20 sec perturbations cueing to not let arm move with perturbations  Standing Shoulder ER/IR RTB: 2 x 10 reps  Upper Trapezius Stretch: bilat 1 x 30sec each  Bilateral Shoulder Pull GTB aparts 1 x 10 reps                          PT Education - 10/22/20 1612     Education Details Pt educated in therex form and technique.    Person(s)  Educated Patient    Methods Explanation;Demonstration    Comprehension Verbalized understanding;Returned demonstration              PT Short Term Goals - 10/09/20 1311       PT SHORT TERM GOAL #1   Title Pt will be independent with HEP in order to improve strength and decrease pain in order to improve pain-free function at home and work.    Baseline 10/09/20 HEP given    Time 4    Period Weeks    Status New               PT Long Term Goals - 10/09/20 1312       PT LONG TERM GOAL #1   Title Pt will decrease worst pain as reported on NPRS by at least 3 points in order to demonstrate clinically significant reduction in pain.    Baseline 10/09/20 8/10    Time 8     Period Weeks    Status New      PT LONG TERM GOAL #2   Title Pt will demonstrate full L active shoulder ROM in order to complete household ADLs and self care to reflect PLOF    Baseline 10/09/20 flex: 80d abd: 72d ER: C5 IR: S1    Time 8    Period Weeks    Status New      PT LONG TERM GOAL #3   Title Patient will demonstrate all shoulder MMTs to at least 4+/5 in order to complete heavy household ADLs and return to PLOF    Baseline 10/09/20 flex: 2+ abd: 2+ ER: 4  IR: 4+    Time 8    Status New      PT LONG TERM GOAL #4   Title Patient will increase FOTO score to 67 to demonstrate predicted increase in functional mobility to complete ADLs    Baseline 10/09/20 50    Time 8    Period Weeks    Status New                      Plan - 10/22/20 1607     Clinical Impression Statement PT continued therex and manual therapy for increased mobility and scapulohumeral rhythm with success. Pt demonstrated decreased tone in L trapezius evidenced by improvement in posture and decreased shoulder hiking. Pt continues to be motivated and will continue to benefit from skilled therapy in order to ensure safe exercise progression.    Personal Factors and Comorbidities Age;Sex;Fitness;Past/Current Experience;Time since onset of injury/illness/exacerbation    Examination-Activity Limitations Bathing;Reach Overhead;Lift;Carry;Sleep;Bed Mobility    Examination-Participation Restrictions Driving;Yard Work;Meal Prep;Cleaning;Laundry;Community Activity    PT Treatment/Interventions ADLs/Self Care Home Management;Iontophoresis 4mg /ml Dexamethasone;Therapeutic exercise;Passive range of motion;Joint Manipulations;Spinal Manipulations;Dry needling;Manual techniques;Neuromuscular re-education;Therapeutic activities;Moist Heat;Electrical Stimulation;Cryotherapy;Ultrasound;DME Instruction;Functional mobility training;Balance training;Patient/family education    PT Next Visit Plan Y/T/I MMT, TDN?    PT Home  Exercise Plan pulleys and UT stretch             Patient will benefit from skilled therapeutic intervention in order to improve the following deficits and impairments:  Decreased coordination, Decreased endurance, Decreased range of motion, Decreased strength, Decreased mobility, Hypomobility, Increased fascial restricitons, Impaired UE functional use, Improper body mechanics, Pain, Postural dysfunction, Impaired flexibility, Increased muscle spasms  Visit Diagnosis: Chronic left shoulder pain     Problem List There are no problems to display for this patient.   Durwin Reges DPT Sharion Settler, SPT Durwin Reges 10/23/2020, 10:51  AM  [facility]ICAL [facility] SPORTS MEDICINE [address] [place], [address] Phone: [phone]   Fax:  [phone]  Name: Tiffany Wilcox MRN: [id number] Date of Birth: 04/23/1951

## 2020-10-24 ENCOUNTER — Other Ambulatory Visit: Payer: Self-pay

## 2020-10-24 ENCOUNTER — Encounter: Payer: Self-pay | Admitting: Physical Therapy

## 2020-10-24 ENCOUNTER — Ambulatory Visit: Payer: Medicare HMO | Admitting: Occupational Therapy

## 2020-10-24 ENCOUNTER — Ambulatory Visit: Payer: Medicare HMO | Admitting: Physical Therapy

## 2020-10-24 DIAGNOSIS — M25642 Stiffness of left hand, not elsewhere classified: Secondary | ICD-10-CM | POA: Diagnosis not present

## 2020-10-24 DIAGNOSIS — M79642 Pain in left hand: Secondary | ICD-10-CM

## 2020-10-24 DIAGNOSIS — G8929 Other chronic pain: Secondary | ICD-10-CM | POA: Diagnosis not present

## 2020-10-24 DIAGNOSIS — M65312 Trigger thumb, left thumb: Secondary | ICD-10-CM

## 2020-10-24 DIAGNOSIS — M25512 Pain in left shoulder: Secondary | ICD-10-CM

## 2020-10-24 NOTE — Therapy (Signed)
[facility]ORTS MEDICINE [address], [place], [address] Phone: [phone]   Fax:  [phone]  Occupational Therapy Treatment  Patient Details  Name: Tiffany Wilcox MRN: [id number] Date of Birth: 30-Mar-1951 Referring Provider (OT): Dr Lyn Hollingshead   Encounter Date: 10/24/2020   OT End of Session - 10/24/20 1838     Visit Number 6    Number of Visits 12    Date for OT Re-Evaluation 11/20/20    OT Start Time 1550    OT Stop Time 1639    OT Time Calculation (min) 49 min    Activity Tolerance Patient tolerated treatment well    Behavior During Therapy [facility] for tasks assessed/performed             No past medical history on file.  No past surgical history on file.  There were no vitals filed for this visit.   Subjective Assessment - 10/24/20 1558     Subjective  yesterday was not good day - had couple of triggers and some pain - did my bandaid and went to bed - but think it was the way you grippped the bands, but they change it this date to my forearm - that  I did not had to grip it    Pertinent History L trigger thumb started about 2 months ago - her PCP refer her to DR Lyn Hollingshead -pt decline shot and refer to OT for L trigger thumb    Patient Stated Goals Want my L thumb pain and motion better so I can crochet , work in the yard and use it like before    Currently in Pain? Yes    Pain Score 3     Pain Orientation Left    Pain Descriptors / Indicators Tender    Pain Type Acute pain    Pain Onset More than a month ago               Pt show increase composite flexion of thumb and opposition - with less locking or clicking and pain  Pt ed on gripping object lightly but enlarge grips, loose grip and cup thumb just lightly around the hand     Tenderness decrease from 10/10 to 3/10 since eval        Skin check done - no issues tolerating ionto - and pt to keep patch on for hour afterwards      OT  Treatments/Exercises (OP) - 10/18/20 0001                     Iontophoresis    Type of Iontophoresis Dexamethasone    Location L thumb A1pulley    Dose med patch , 2 current    Time 19           LUE Contrast Bath    Time 8 minutes    Comments prior to soft tissue and ROM                   Review again with HEP - contrast - 2 x day AROM pain free for PA and RA And opposition - to all digits and slide down 5th - no pain and clicking Cont Ice massage several times during day over A1pulley at thumb And modify grip - use palms or larger joint, avoid lat grip or pinch and enlarge grips - for gardening   Can use night time bandaid on thumb IP to decrease locking  and but not during day anymore                   OT Treatments/Exercises (OP) - 10/24/20 0001       Iontophoresis   Type of Iontophoresis Dexamethasone    Location L thumb A1pulley    Dose med patch , 2 current    Time 19      LUE Contrast Bath   Time 8 minutes    Comments prior to soft tissue and ROM                    OT Education - 10/24/20 1837     Education Details Ionto use and HEP    Person(s) Educated Patient    Methods Explanation;Verbal cues;Tactile cues;Demonstration;Handout    Comprehension Verbal cues required;Returned demonstration;Verbalized understanding              OT Short Term Goals - 10/09/20 1303       OT SHORT TERM GOAL #1   Title Pt to be indepedent in HEP to decrease pain, tendernes and triggering in L thumb    Baseline pain 4-7/10 and night time can increase to 10/10 , and tender - decrease AROM IP 60 and opposition - triggering several times at night time    Time 3    Period Weeks    Status New    Target Date 10/30/20               OT Long Term Goals - 10/09/20 1304       OT LONG TERM GOAL #1   Title L thumb AROM improve to WNL to use in ADL's without triggering    Baseline decrease RA and pain with flexion of thumb and opposition  .Triggering worse nigh time    Time 5    Target Date 11/13/20      OT LONG TERM GOAL #2   Title Pt be independent in modifications to decrease risk for triggering and return to prior level of function    Baseline no knowledge on modifications and tasks that increase her risk for trigger thumb    Time 6    Period Weeks    Status New    Target Date 11/20/20                   Plan - 10/24/20 1838     Clinical Impression Statement Pt refer to OT with L non dominant trigger thumb. Pt pain at eval in thumb from 4-7/10 , tenderness over A1pulley 10/10 and at night time. Pain decrease to 3/10.Pt  show increase flexion in thumb and opposition to 2nd fold of 5th with less of pull  - did had some tenderness over the 1st dorsal compartment this last time but improved- pt keeping thumb out to side and extention when gripping objects and bands in PT but improve this date - changed to forearm- pt ed on start to grip objects lightly but enlarge them and light flexoin around the digits with thumb. Pt to cont use  modfications in tasks and immobilization to IP at night time and to decrease pain and increase AROM withtout triggering and pain - 5th session of ionto done this date - tolerated well - pt limited in use of L hand in ADlL's and IADL's    OT Occupational Profile and History Problem Focused Assessment - Including review of records relating to presenting problem    Occupational performance deficits (Please refer  to evaluation for details): ADL's;IADL's;Rest and Sleep;Play;Leisure;Social Participation    Body Structure / Function / Physical Skills ADL;Strength;Decreased knowledge of use of DME;Pain;UE functional use;ROM;IADL;Flexibility;FMC    Clinical Decision Making Limited treatment options, no task modification necessary    Comorbidities Affecting Occupational Performance: None    Modification or Assistance to Complete Evaluation  No modification of tasks or assist necessary to complete eval     OT Frequency 2x / week    OT Duration 6 weeks    OT Treatment/Interventions Self-care/ADL training;Cryotherapy;Iontophoresis;Ultrasound;Therapeutic exercise;Contrast Bath;Splinting;DME and/or AE instruction;Passive range of motion;Manual Therapy    Consulted and Agree with Plan of Care Patient             Patient will benefit from skilled therapeutic intervention in order to improve the following deficits and impairments:   Body Structure / Function / Physical Skills: ADL, Strength, Decreased knowledge of use of DME, Pain, UE functional use, ROM, IADL, Flexibility, [facility]       Visit Diagnosis: Trigger thumb of left hand  Stiffness of left hand, not elsewhere classified  Pain in left hand    Problem List There are no problems to display for this patient.   Oletta Cohn OTR/L,CLT 10/24/2020, 6:42 PM  [facility] [address] [place], [address] Phone: [phone]   Fax:  [phone]  Name: Tiffany Wilcox MRN: [id number] Date of Birth: 02-07-1951

## 2020-10-24 NOTE — Therapy (Signed)
[facility] [address], [place], [address] Phone: [phone]   Fax:  [phone]  Physical Therapy Treatment  Patient Details  Name: Tiffany Wilcox MRN: [id number] Date of Birth: 10-Apr-1951 No data recorded  Encounter Date: 10/24/2020   PT End of Session - 10/24/20 1523     Visit Number 5    Number of Visits 17    Date for PT Re-Evaluation 12/07/20    PT Start Time 1505    PT Stop Time 1545    PT Time Calculation (min) 40 min    Activity Tolerance Patient tolerated treatment well    Behavior During Therapy [facility] for tasks assessed/performed             History reviewed. No pertinent past medical history.  History reviewed. No pertinent surgical history.  There were no vitals filed for this visit.   Subjective Assessment - 10/24/20 1503     Subjective Pt reports to therapy with no concerns of  L shoulder pain at rest. Pt states pain only occurs at certain ranges.    Pertinent History Pt is a 70 year old female presenting with L shoulder pain since Feb 2022. 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 Reports no MOI, just that she started noticing that her shoulder hurt 10/10 pain with overhead reaching. She reports her pain is a C-shape cap over her shoulder, and some on the posterior superior shoulder. She has had ultrasound imaging in April revealing "small RTC tear". Worst pain over the past couple weeks 8/10; best 3/10. Her pain is achy most of the time, but when she lifts her arm she has a sharp and burning. Denies popping/clicking or tingling/numbess. Pain is aggravated by laying on either side, overhead reaching, overhead lifting. Denies pain with pushing and pulling. She is retired, works part time as a Armed forces training and education officer, her job duties does not bother her shoulder, but she is mindful when she carries files. Enjoys gardening and crocheting and has been having trouble with this since her  pain has started. She was completing [place] until Oct of 2021, during a grieving period, and tried to start again in Feb 2022, but her shoulder was too painful. She does report walking multiple times per week. She would like to return Tai Chi 3x/week and swimming 1x/week. Pt denies N/V, B&B changes, unexplained weight fluctuation, saddle paresthesia, fever, night sweats, or unrelenting night pain at this time. No falls in past 31mo.              Therapeutic Exercise  Pulleys: Flex, Scap, ABD x 60 sec each  Standing Shoulder ER GTB 2 x12 reps  TRX Overhead Stretch Flex/ABD2 x 60  TRX Row cues to scap squeeze and bring elbows to pockets 2 x 10 reps  Omega Tricep press down 1x 12 reps #10, 1 x 5 reps #15  Serratus Punches 2# 2 x 10 reps cues to hold 2-3 sec at end of the punch   Manual Therapy  DN: Dry Needling: (2/2) 346m.30 needles placed along the L UT and levator to decrease increased muscular spasms and trigger points with the patient positioned in supine, pincer grasp to trigger points in each area. Patient was educated on risks and benefits of therapy and verbally consents to PT.  PT Education - 10/24/20 1544     Education Details Pt educated on the benefits and adverse effects of DN.    Person(s) Educated Patient    Methods Explanation    Comprehension Verbalized understanding              PT Short Term Goals - 10/09/20 1311       PT SHORT TERM GOAL #1   Title Pt will be independent with HEP in order to improve strength and decrease pain in order to improve pain-free function at home and work.    Baseline 10/09/20 HEP given    Time 4    Period Weeks    Status New               PT Long Term Goals - 10/09/20 1312       PT LONG TERM GOAL #1   Title Pt will decrease worst pain as reported on NPRS by at least 3 points in order to demonstrate clinically significant reduction in pain.    Baseline 10/09/20  8/10    Time 8    Period Weeks    Status New      PT LONG TERM GOAL #2   Title Pt will demonstrate full L active shoulder ROM in order to complete household ADLs and self care to reflect PLOF    Baseline 10/09/20 flex: 80d abd: 72d ER: C5 IR: S1    Time 8    Period Weeks    Status New      PT LONG TERM GOAL #3   Title Patient will demonstrate all shoulder MMTs to at least 4+/5 in order to complete heavy household ADLs and return to PLOF    Baseline 10/09/20 flex: 2+ abd: 2+ ER: 4  IR: 4+    Time 8    Status New      PT LONG TERM GOAL #4   Title Patient will increase FOTO score to 67 to demonstrate predicted increase in functional mobility to complete ADLs    Baseline 10/09/20 50    Time 8    Period Weeks    Status New                   Plan - 10/24/20 1546     Clinical Impression Statement Pt tolerated session well evidenced by no increase in L shoulder pain following therex. Pt does continue to display increased L shoulder ROM and increased periscapular strength. Pt is able to comply with all multimodal cuing for proper techinque of therex with good motivation throughout session. Pt responds well to TDN with visibly decreased shoulder height and decreased palpable tension of levator and UT following. Pt will continue to bendfit from skilled therapy to address impairments and achieve set goals.    Personal Factors and Comorbidities Age;Sex;Fitness;Past/Current Experience;Time since onset of injury/illness/exacerbation    Examination-Activity Limitations Bathing;Reach Overhead;Lift;Carry;Sleep;Bed Mobility    Examination-Participation Restrictions Driving;Yard Work;Meal Prep;Cleaning;Laundry;Community Activity    Stability/Clinical Decision Making Evolving/Moderate complexity    Clinical Decision Making Moderate    Rehab Potential Good    PT Frequency 2x / week    PT Duration 8 weeks    PT Treatment/Interventions ADLs/Self Care Home Management;Iontophoresis 67m/ml  Dexamethasone;Therapeutic exercise;Passive range of motion;Joint Manipulations;Spinal Manipulations;Dry needling;Manual techniques;Neuromuscular re-education;Therapeutic activities;Moist Heat;Electrical Stimulation;Cryotherapy;Ultrasound;DME Instruction;Functional mobility training;Balance training;Patient/family education    PT Next Visit Plan TRX stretches, deltoid strengthing, and long duration weighted stretching    PT Home Exercise Plan pulleys and UT stretch  Consulted and Agree with Plan of Care Patient             Patient will benefit from skilled therapeutic intervention in order to improve the following deficits and impairments:  Decreased coordination, Decreased endurance, Decreased range of motion, Decreased strength, Decreased mobility, Hypomobility, Increased fascial restricitons, Impaired UE functional use, Improper body mechanics, Pain, Postural dysfunction, Impaired flexibility, Increased muscle spasms  Visit Diagnosis: Chronic left shoulder pain     Problem List There are no problems to display for this patient.  Durwin Reges DPT Sharion Settler, SPT  Durwin Reges 10/25/2020, 8:23 AM  [facility] [address], [place], [address] Phone: [phone]   Fax:  [phone]  Name: Tiffany Wilcox MRN: [id number] Date of Birth: 10/04/1950

## 2020-10-29 ENCOUNTER — Ambulatory Visit: Payer: Medicare HMO | Admitting: Occupational Therapy

## 2020-10-29 ENCOUNTER — Other Ambulatory Visit: Payer: Self-pay

## 2020-10-29 ENCOUNTER — Encounter: Payer: Self-pay | Admitting: Physical Therapy

## 2020-10-29 ENCOUNTER — Ambulatory Visit: Payer: Medicare HMO | Admitting: Physical Therapy

## 2020-10-29 DIAGNOSIS — M79642 Pain in left hand: Secondary | ICD-10-CM | POA: Diagnosis not present

## 2020-10-29 DIAGNOSIS — G8929 Other chronic pain: Secondary | ICD-10-CM | POA: Diagnosis not present

## 2020-10-29 DIAGNOSIS — M25642 Stiffness of left hand, not elsewhere classified: Secondary | ICD-10-CM

## 2020-10-29 DIAGNOSIS — M25512 Pain in left shoulder: Secondary | ICD-10-CM | POA: Diagnosis not present

## 2020-10-29 DIAGNOSIS — M65312 Trigger thumb, left thumb: Secondary | ICD-10-CM | POA: Diagnosis not present

## 2020-10-29 NOTE — Therapy (Signed)
[facility] [address] [place], [address] Phone: [phone]   Fax:  [phone]  Occupational Therapy Treatment  Patient Details  Name: Tiffany Wilcox MRN: [id number] Date of Birth: 1950-12-20 Referring Provider (OT): Dr Lyn Hollingshead   Encounter Date: 10/29/2020   OT End of Session - 10/29/20 1006     Visit Number 7    Number of Visits 12    Date for OT Re-Evaluation 11/20/20    OT Start Time 0900    OT Stop Time 0948    OT Time Calculation (min) 48 min    Activity Tolerance Patient tolerated treatment well    Behavior During Therapy [facility] for tasks assessed/performed             No past medical history on file.  No past surgical history on file.  There were no vitals filed for this visit.   Subjective Assessment - 10/29/20 1005     Subjective  DOing okay - better- had out of the last 7 days - 2 days that it clicked some - but the other days good- using it more - with light grip    Pertinent History L trigger thumb started about 2 months ago - her PCP refer her to DR Lyn Hollingshead -pt decline shot and refer to OT for L trigger thumb    Patient Stated Goals Want my L thumb pain and motion better so I can crochet , work in the yard and use it like before    Pain Location Hand    Pain Orientation Left    Pain Descriptors / Indicators Tender    Pain Type Acute pain    Pain Onset More than a month ago    Pain Frequency Intermittent               Pt show increase composite flexion of thumb and opposition - with less locking or clicking and pain Pt do have decrease MC flexion of thumb on bilateral hands  Ed on doing contrast in the am and gentle PROM to IP and MC - for the day and maybe once or 2 again days if not as active Cont with  gripping object lightly but enlarge grips, loose grip and cup thumb just lightly around the hand     Tenderness decrease from 10/10 to 3-5/10 since eval  Had 2  days of clicking out of the 7 days this past week        Skin check done - no issues tolerating ionto - and pt to keep patch on for hour afterwards            OT Treatments/Exercises (OP) - 10/29/20 0001       Iontophoresis   Type of Iontophoresis Dexamethasone    Location L thumb A1pulley    Dose med patch , 2 current    Time 19                 Review again with HEP - contrast - 2 x day AROM pain free for PA and  Gentle PROM to IP and MC , composite of thumb And opposition - to all digits and slide down 5th - no pain and clicking Cont Ice massage several times during day over A1pulley at thumb And modify grip - use palms or larger joint, avoid lat grip or pinch and enlarge grips - for gardening Isometric for strengthening of thumb in 45 degrees of PA of  thumb - gentle 5 reps at time    Can use night time bandaid on thumb IP to decrease locking and but not during day anymore                     OT Education - 10/29/20 1006     Education Details Ionto use and HEP changes    Person(s) Educated Patient    Methods Explanation;Verbal cues;Tactile cues;Demonstration;Handout    Comprehension Verbal cues required;Returned demonstration;Verbalized understanding              OT Short Term Goals - 10/09/20 1303       OT SHORT TERM GOAL #1   Title Pt to be indepedent in HEP to decrease pain, tendernes and triggering in L thumb    Baseline pain 4-7/10 and night time can increase to 10/10 , and tender - decrease AROM IP 60 and opposition - triggering several times at night time    Time 3    Period Weeks    Status New    Target Date 10/30/20               OT Long Term Goals - 10/09/20 1304       OT LONG TERM GOAL #1   Title L thumb AROM improve to WNL to use in ADL's without triggering    Baseline decrease RA and pain with flexion of thumb and opposition .Triggering worse nigh time    Time 5    Target Date 11/13/20      OT LONG TERM GOAL #2    Title Pt be independent in modifications to decrease risk for triggering and return to prior level of function    Baseline no knowledge on modifications and tasks that increase her risk for trigger thumb    Time 6    Period Weeks    Status New    Target Date 11/20/20                   Plan - 10/29/20 1007     Clinical Impression Statement Pt refer to OT with L non dominant trigger thumb. Pt pain at eval in thumb from 4-7/10 , tenderness over A1pulley 10/10 and at night time. Pain decrease to 0-3/10 with use and tenderness still 5/10 at times. Pt  show increase flexion in thumb and opposition to base of 5th with less of pull  - did had some tenderness over the 1st dorsal compartment this last time but improving- pt keeping thumb out to side and extention when gripping objects and bands in PT but improve since last week - changed to forearm- pt ed on start to grip objects lightly but enlarge them and light flexoin around the digits with thumb. Pt to cont use  modfications in tasks and immobilization to IP at night time and to decrease pain and increase AROM withtout triggering and pain - 7 th session of ionto done this date - tolerated well - pt limited in use of L hand in ADlL's and IADL's    OT Occupational Profile and History Problem Focused Assessment - Including review of records relating to presenting problem    Occupational performance deficits (Please refer to evaluation for details): ADL's;IADL's;Rest and Sleep;Play;Leisure;Social Participation    Body Structure / Function / Physical Skills ADL;Strength;Decreased knowledge of use of DME;Pain;UE functional use;ROM;IADL;Flexibility;FMC    Clinical Decision Making Limited treatment options, no task modification necessary    Comorbidities Affecting Occupational Performance: None  Modification or Assistance to Complete Evaluation  No modification of tasks or assist necessary to complete eval    OT Frequency 2x / week    OT Duration  6 weeks    OT Treatment/Interventions Self-care/ADL training;Cryotherapy;Iontophoresis;Ultrasound;Therapeutic exercise;Contrast Bath;Splinting;DME and/or AE instruction;Passive range of motion;Manual Therapy    Consulted and Agree with Plan of Care Patient             Patient will benefit from skilled therapeutic intervention in order to improve the following deficits and impairments:   Body Structure / Function / Physical Skills: ADL, Strength, Decreased knowledge of use of DME, Pain, UE functional use, ROM, IADL, Flexibility, [facility]       Visit Diagnosis: Trigger thumb of left hand  Stiffness of left hand, not elsewhere classified  Pain in left hand    Problem List There are no problems to display for this patient.   Oletta Cohn OTR/L,CLT 10/29/2020, 10:12 AM  [place] [facility] [address], [place], [address] Phone: [phone]   Fax:  [phone]  Name: Tiffany Wilcox MRN: [id number] Date of Birth: 01-03-1951

## 2020-10-29 NOTE — Therapy (Signed)
[facility] [address] [place], [address] Phone: [phone]   Fax:  [phone]  Physical Therapy Treatment  Patient Details  Name: Tiffany Wilcox MRN: [id number] Date of Birth: May 19, 1950 No data recorded  Encounter Date: 10/29/2020   PT End of Session - 10/29/20 0840     Visit Number 6    Number of Visits 17    Date for PT Re-Evaluation 12/07/20    PT Start Time 0820    PT Stop Time 0900    PT Time Calculation (min) 40 min    Activity Tolerance Patient tolerated treatment well    Behavior During Therapy [facility] for tasks assessed/performed             History reviewed. No pertinent past medical history.  History reviewed. No pertinent surgical history.  There were no vitals filed for this visit.   Subjective Assessment - 10/29/20 0824     Subjective Pt reports she felt sore following last session, but thinks she was able to stay a little more "loose" and pay attention to her posture better. No updates since last session. Reports 0/10 pain today.    Pertinent History Pt is a 70 year old female presenting with L shoulder pain since Feb 2022. 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 Reports no MOI, just that she started noticing that her shoulder hurt 10/10 pain with overhead reaching. She reports her pain is a C-shape cap over her shoulder, and some on the posterior superior shoulder. She has had ultrasound imaging in April revealing "small RTC tear". Worst pain over the past couple weeks 8/10; best 3/10. Her pain is achy most of the time, but when she lifts her arm she has a sharp and burning. Denies popping/clicking or tingling/numbess. Pain is aggravated by laying on either side, overhead reaching, overhead lifting. Denies pain with pushing and pulling. She is retired, works part time as a Armed forces training and education officer, her job duties does not bother her shoulder, but she is mindful when she carries  files. Enjoys gardening and crocheting and has been having trouble with this since her pain has started. She was completing [place] until Oct of 2021, during a grieving period, and tried to start again in Feb 2022, but her shoulder was too painful. She does report walking multiple times per week. She would like to return Tai Chi 3x/week and swimming 1x/week. Pt denies N/V, B&B changes, unexplained weight fluctuation, saddle paresthesia, fever, night sweats, or unrelenting night pain at this time. No falls in past 43mo.    Limitations Lifting;House hold activities    How long can you sit comfortably? unlimited    How long can you stand comfortably? unlimited    How long can you walk comfortably? unlimited    Diagnostic tests [facility] April 2022    Patient Stated Goals return to exercises    Pain Onset More than a month ago            Therapeutic Exercise   Pulleys: Flex, Scap, ABD x 60 sec each  Bent over rows 15# 2x 10 with good cuing for proper technique following demo  Standing at wall 90/90 ER 2x 10 with difficulty maintain elbow height to 90d  Push up plus x15 with cuing for scapular movement without elevation with excellent carry over     Manual Therapy STM with trigger point release to L latissimus, L UT and levator following:   DN:  Dry Needling: (2/2) 64m .30 needles placed along the  L latissimus and L UT to decrease increased muscular spasms and trigger points with the patient positioned in supine, pincer grasp to trigger points in each area. Patient was educated on risks and benefits of therapy and verbally consents to PT.  PROM all directions                                      PT Education - 10/29/20 0832     Education Details therex form/technique, TDN    Person(s) Educated Patient    Methods Explanation;Verbal cues;Demonstration    Comprehension Verbalized understanding;Returned demonstration;Verbal cues required               PT Short Term Goals - 10/09/20 1311       PT SHORT TERM GOAL #1   Title Pt will be independent with HEP in order to improve strength and decrease pain in order to improve pain-free function at home and work.    Baseline 10/09/20 HEP given    Time 4    Period Weeks    Status New               PT Long Term Goals - 10/09/20 1312       PT LONG TERM GOAL #1   Title Pt will decrease worst pain as reported on NPRS by at least 3 points in order to demonstrate clinically significant reduction in pain.    Baseline 10/09/20 8/10    Time 8    Period Weeks    Status New      PT LONG TERM GOAL #2   Title Pt will demonstrate full L active shoulder ROM in order to complete household ADLs and self care to reflect PLOF    Baseline 10/09/20 flex: 80d abd: 72d ER: C5 IR: S1    Time 8    Period Weeks    Status New      PT LONG TERM GOAL #3   Title Patient will demonstrate all shoulder MMTs to at least 4+/5 in order to complete heavy household ADLs and return to PLOF    Baseline 10/09/20 flex: 2+ abd: 2+ ER: 4  IR: 4+    Time 8    Status New      PT LONG TERM GOAL #4   Title Patient will increase FOTO score to 67 to demonstrate predicted increase in functional mobility to complete ADLs    Baseline 10/09/20 50    Time 8    Period Weeks    Status New                   Plan - 10/29/20 0910     Clinical Impression Statement PT continued therex progression for increased L shoulder mobility with focus of scapulohumeral rhythm with success. Patient demonstrates good carry over of all multimodal cuing for therex, with good understanding of carry over into functional lifting, pushing/pulling in her garden. Patient continues to tolerate TDN well with increase in flex and abd motion approx 20d following latissimus TDN as well. PT will continue progression as able.    Personal Factors and Comorbidities Age;Sex;Fitness;Past/Current Experience;Time since onset of  injury/illness/exacerbation    Examination-Activity Limitations Bathing;Reach Overhead;Lift;Carry;Sleep;Bed Mobility    Examination-Participation Restrictions Driving;Yard Work;Meal Prep;Cleaning;Laundry;Community Activity    Stability/Clinical Decision Making Evolving/Moderate complexity    Clinical Decision Making Moderate  Rehab Potential Good    PT Frequency 2x / week    PT Duration 8 weeks    PT Treatment/Interventions ADLs/Self Care Home Management;Iontophoresis 43m/ml Dexamethasone;Therapeutic exercise;Passive range of motion;Joint Manipulations;Spinal Manipulations;Dry needling;Manual techniques;Neuromuscular re-education;Therapeutic activities;Moist Heat;Electrical Stimulation;Cryotherapy;Ultrasound;DME Instruction;Functional mobility training;Balance training;Patient/family education    PT Next Visit Plan TRX stretches, deltoid strengthing, and long duration weighted stretching    PT Home Exercise Plan pulleys and UT stretch    Consulted and Agree with Plan of Care Patient             Patient will benefit from skilled therapeutic intervention in order to improve the following deficits and impairments:  Decreased coordination, Decreased endurance, Decreased range of motion, Decreased strength, Decreased mobility, Hypomobility, Increased fascial restricitons, Impaired UE functional use, Improper body mechanics, Pain, Postural dysfunction, Impaired flexibility, Increased muscle spasms  Visit Diagnosis: Chronic left shoulder pain     Problem List There are no problems to display for this patient.  CDurwin RegesDPT CDurwin Reges6/20/2022, 10:06 AM  [facility] [address] [place] [address]Phone: [phone]  Fax:  [phone] Name: Tiffany SchoenfeldtMRN: [id number] of Birth: [birth year]

## 2020-10-31 ENCOUNTER — Ambulatory Visit: Payer: Medicare HMO | Admitting: Physical Therapy

## 2020-10-31 ENCOUNTER — Encounter: Payer: Self-pay | Admitting: Physical Therapy

## 2020-10-31 ENCOUNTER — Ambulatory Visit: Payer: Medicare HMO | Admitting: Occupational Therapy

## 2020-10-31 ENCOUNTER — Other Ambulatory Visit: Payer: Self-pay

## 2020-10-31 DIAGNOSIS — M25642 Stiffness of left hand, not elsewhere classified: Secondary | ICD-10-CM

## 2020-10-31 DIAGNOSIS — M79642 Pain in left hand: Secondary | ICD-10-CM | POA: Diagnosis not present

## 2020-10-31 DIAGNOSIS — M65312 Trigger thumb, left thumb: Secondary | ICD-10-CM | POA: Diagnosis not present

## 2020-10-31 DIAGNOSIS — G8929 Other chronic pain: Secondary | ICD-10-CM

## 2020-10-31 DIAGNOSIS — M25512 Pain in left shoulder: Secondary | ICD-10-CM | POA: Diagnosis not present

## 2020-10-31 NOTE — Therapy (Signed)
[place] [facility] [address] [place], [address] Phone: [phone]   Fax:  [phone]  Physical Therapy Treatment  Patient Details  Name: Tiffany Wilcox MRN: [id number] Date of Birth: 10/15/1950 No data recorded  Encounter Date: 10/31/2020   PT End of Session - 10/31/20 1604     Visit Number 7    Number of Visits 17    Date for PT Re-Evaluation 12/07/20    PT Start Time 9728    PT Stop Time 1555    PT Time Calculation (min) 40 min    Activity Tolerance Patient tolerated treatment well    Behavior During Therapy [facility] for tasks assessed/performed             History reviewed. No pertinent past medical history.  History reviewed. No pertinent surgical history.  There were no vitals filed for this visit.   Subjective Assessment - 10/31/20 1518     Subjective Pt denies any pain or discomfort in L UE. Pt reports that dry needling left her a little sore but feels it improve her motion.    Pertinent History Pt is a 70 year old female presenting with L shoulder pain since Feb 2022. 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 Reports no MOI, just that she started noticing that her shoulder hurt 10/10 pain with overhead reaching. She reports her pain is a C-shape cap over her shoulder, and some on the posterior superior shoulder. She has had ultrasound imaging in April revealing "small RTC tear". Worst pain over the past couple weeks 8/10; best 3/10. Her pain is achy most of the time, but when she lifts her arm she has a sharp and burning. Denies popping/clicking or tingling/numbess. Pain is aggravated by laying on either side, overhead reaching, overhead lifting. Denies pain with pushing and pulling. She is retired, works part time as a Armed forces training and education officer, her job duties does not bother her shoulder, but she is mindful when she carries files. Enjoys gardening and crocheting and has been having trouble with  this since her pain has started. She was completing [place] until Oct of 2021, during a grieving period, and tried to start again in Feb 2022, but her shoulder was too painful. She does report walking multiple times per week. She would like to return Tai Chi 3x/week and swimming 1x/week. Pt denies N/V, B&B changes, unexplained weight fluctuation, saddle paresthesia, fever, night sweats, or unrelenting night pain at this time. No falls in past 38mos.    Limitations Lifting;House hold activities                Therapeutic Exercise:  L sholder AAROM with Dowel FLEX/ER each 1 x 90 sec  L sholder AAROM with Dowel ABD 2 x 90 sec  TRX Low row 2 x 10 reps emphasis on scapular retraction  TRX mid row 2 x 10 emphasis on scapular retraction  Lat pull down #10 1 x 10 reps, 20# 1 x1 0 reps  ER/IR GTB 2 x 10 reps 2-3 sec hold  Small body blade 2 x 60 ea. UE                      PT Education - 11/01/20 1107     Education Details therex form/technique    Person(s) Educated Patient    Methods Explanation;Tactile cues;Verbal cues    Comprehension Verbalized understanding;Returned demonstration;Verbal cues required  PT Short Term Goals - 10/09/20 1311       PT SHORT TERM GOAL #1   Title Pt will be independent with HEP in order to improve strength and decrease pain in order to improve pain-free function at home and work.    Baseline 10/09/20 HEP given    Time 4    Period Weeks    Status New               PT Long Term Goals - 10/09/20 1312       PT LONG TERM GOAL #1   Title Pt will decrease worst pain as reported on NPRS by at least 3 points in order to demonstrate clinically significant reduction in pain.    Baseline 10/09/20 8/10    Time 8    Period Weeks    Status New      PT LONG TERM GOAL #2   Title Pt will demonstrate full L active shoulder ROM in order to complete household ADLs and self care to reflect PLOF    Baseline 10/09/20 flex:  80d abd: 72d ER: C5 IR: S1    Time 8    Period Weeks    Status New      PT LONG TERM GOAL #3   Title Patient will demonstrate all shoulder MMTs to at least 4+/5 in order to complete heavy household ADLs and return to PLOF    Baseline 10/09/20 flex: 2+ abd: 2+ ER: 4  IR: 4+    Time 8    Status New      PT LONG TERM GOAL #4   Title Patient will increase FOTO score to 67 to demonstrate predicted increase in functional mobility to complete ADLs    Baseline 10/09/20 50    Time 8    Period Weeks    Status New                   Plan - 10/31/20 1606     Clinical Impression Statement Pt tolerated session well evidenced by no increase in L shoulder pain throughtout session. Pt continues to progress well with PT with a marked increase in L shoulder AROM. Pt needed multimodal cues to ensure proper form/technique. Pt will continue to progress as able.    Personal Factors and Comorbidities Age;Sex;Fitness;Past/Current Experience;Time since onset of injury/illness/exacerbation    Examination-Activity Limitations Bathing;Reach Overhead;Lift;Carry;Sleep;Bed Mobility    Examination-Participation Restrictions Driving;Yard Work;Meal Prep;Cleaning;Laundry;Community Activity    PT Treatment/Interventions ADLs/Self Care Home Management;Iontophoresis 4mg /ml Dexamethasone;Therapeutic exercise;Passive range of motion;Joint Manipulations;Spinal Manipulations;Dry needling;Manual techniques;Neuromuscular re-education;Therapeutic activities;Moist Heat;Electrical Stimulation;Cryotherapy;Ultrasound;DME Instruction;Functional mobility training;Balance training;Patient/family education    PT Next Visit Plan TRX stretches, deltoid strengthing, and long duration weighted stretching    PT Home Exercise Plan pulleys and UT stretch             Patient will benefit from skilled therapeutic intervention in order to improve the following deficits and impairments:  Decreased coordination, Decreased endurance,  Decreased range of motion, Decreased strength, Decreased mobility, Hypomobility, Increased fascial restricitons, Impaired UE functional use, Improper body mechanics, Pain, Postural dysfunction, Impaired flexibility, Increased muscle spasms  Visit Diagnosis: Chronic left shoulder pain     Problem List There are no problems to display for this patient.   Durwin Reges DPT Sharion Settler, SPT  Durwin Reges 11/01/2020, 11:09 AM  [place] [facility] [address] [place], [address] Phone: [phone]   Fax:  [phone]  Name: Tiffany Wilcox  Tiffany Wilcox MRN: [id number] Date of Birth: 24-Jun-1950

## 2020-10-31 NOTE — Therapy (Signed)
[place] [facility] [address], [place], [address] Phone: [phone]   Fax:  [phone]  Occupational Therapy Treatment  Patient Details  Name: Tiffany Wilcox MRN: [id number] Date of Birth: July 30, 1950 Referring Provider (OT): Dr Lyn Hollingshead   Encounter Date: 10/31/2020   OT End of Session - 10/31/20 1514     Visit Number 8    Number of Visits 12    Date for OT Re-Evaluation 11/20/20    OT Start Time 1445    OT Stop Time 1515    OT Time Calculation (min) 30 min    Activity Tolerance Patient tolerated treatment well    Behavior During Therapy [facility] for tasks assessed/performed             No past medical history on file.  No past surgical history on file.  There were no vitals filed for this visit.   Subjective Assessment - 10/31/20 1512     Subjective  Yesterday had a couple of clicks - but today good - pain better, motion , modifying and amounts of triggering - but still tender    Pertinent History L trigger thumb started about 2 months ago - her PCP refer her to DR Lyn Hollingshead -pt decline shot and refer to OT for L trigger thumb    Patient Stated Goals Want my L thumb pain and motion better so I can crochet , work in the yard and use it like before    Pain Score 5     Pain Location Hand   A1pulley of thumb   Pain Orientation Left    Pain Descriptors / Indicators Tender    Pain Type Acute pain    Pain Onset More than a month ago               Pt show increase composite flexion of thumb and opposition - with less locking or clicking and pain - but cont 5/10 tenderness Pt do have decrease MC flexion of thumb on bilateral hands Cont at home  contrast in the am and gentle PROM to IP and composite flexion of thumb  - for the day and maybe once or 2 again days if not as active Cont with  gripping object lightly but enlarge grips, loose grip and cup thumb just lightly around the hand     Had 2  days of clicking out of the 7 days this past week        Skin check done - no issues tolerating ionto - and pt to keep patch on for hour afterwards                OT Treatments/Exercises (OP) - 10/31/20 0001       Moist Heat Therapy   Number Minutes Moist Heat 5 Minutes    Moist Heat Location Hand      Iontophoresis   Type of Iontophoresis Dexamethasone    Location L thumb A1pulley    Dose med patch , 2 current    Time 19              sof tissue mobs done to webspace , thumb - radial forearm - graston tool nr 2 sweeping  Prior to AAROM of thumb IP , MC and composite       OT Education - 10/31/20 1514     Education Details Ionto use and HEP changes    Person(s) Educated Patient    Methods Explanation;Verbal cues;Tactile  cues;Demonstration;Handout    Comprehension Verbal cues required;Returned demonstration;Verbalized understanding              OT Short Term Goals - 10/09/20 1303       OT SHORT TERM GOAL #1   Title Pt to be indepedent in HEP to decrease pain, tendernes and triggering in L thumb    Baseline pain 4-7/10 and night time can increase to 10/10 , and tender - decrease AROM IP 60 and opposition - triggering several times at night time    Time 3    Period Weeks    Status New    Target Date 10/30/20               OT Long Term Goals - 10/09/20 1304       OT LONG TERM GOAL #1   Title L thumb AROM improve to WNL to use in ADL's without triggering    Baseline decrease RA and pain with flexion of thumb and opposition .Triggering worse nigh time    Time 5    Target Date 11/13/20      OT LONG TERM GOAL #2   Title Pt be independent in modifications to decrease risk for triggering and return to prior level of function    Baseline no knowledge on modifications and tasks that increase her risk for trigger thumb    Time 6    Period Weeks    Status New    Target Date 11/20/20                   Plan - 10/31/20 1514      Clinical Impression Statement Pt refer to OT with L non dominant trigger thumb. Pt pain at eval in thumb from 4-7/10 , tenderness over A1pulley 10/10 and at night time. Pain decrease to 0-3/10 with use and tenderness still 5/10 at times. Pt  show increase flexion in thumb and opposition to base of 5th with less of pull  - pt ed on start to grip objects lightly but enlarge them and light flexoin around the digits with thumb. Pt to cont use  modfications in tasks and immobilization to IP at night time and to decrease pain and increase AROM withtout triggering and pain - 8 th session of ionto done this date - tolerated well - pt limited in use of L hand in ADlL's and IADL's    OT Occupational Profile and History Problem Focused Assessment - Including review of records relating to presenting problem    Occupational performance deficits (Please refer to evaluation for details): ADL's;IADL's;Rest and Sleep;Play;Leisure;Social Participation    Body Structure / Function / Physical Skills ADL;Strength;Decreased knowledge of use of DME;Pain;UE functional use;ROM;IADL;Flexibility;FMC    Rehab Potential Fair    Clinical Decision Making Limited treatment options, no task modification necessary    Comorbidities Affecting Occupational Performance: None    Modification or Assistance to Complete Evaluation  No modification of tasks or assist necessary to complete eval    OT Frequency 2x / week    OT Duration 6 weeks    OT Treatment/Interventions Self-care/ADL training;Cryotherapy;Iontophoresis;Ultrasound;Therapeutic exercise;Contrast Bath;Splinting;DME and/or AE instruction;Passive range of motion;Manual Therapy    Consulted and Agree with Plan of Care Patient             Patient will benefit from skilled therapeutic intervention in order to improve the following deficits and impairments:   Body Structure / Function / Physical Skills: ADL, Strength, Decreased knowledge of use of DME, Pain,  UE functional use, ROM,  IADL, Flexibility, [facility]       Visit Diagnosis: Trigger thumb of left hand  Stiffness of left hand, not elsewhere classified  Pain in left hand    Problem List There are no problems to display for this patient.   Oletta Cohn OTR/L,CLT 10/31/2020, 3:17 PM  [facility] [address], [place], [address] Phone: [phone]   Fax:  [phone]  Name: Krisi Azua MRN: [id number] Date of Birth: March 15, 1951

## 2020-11-01 ENCOUNTER — Encounter: Payer: Self-pay | Admitting: Physical Therapy

## 2020-11-01 ENCOUNTER — Ambulatory Visit
Admission: RE | Admit: 2020-11-01 | Discharge: 2020-11-01 | Disposition: A | Discharge status: Home / Self Care | Payer: Medicare HMO | Source: Ambulatory Visit | Attending: Family Medicine | Admitting: Family Medicine

## 2020-11-01 DIAGNOSIS — Z1231 Encounter for screening mammogram for malignant neoplasm of breast: Secondary | ICD-10-CM

## 2020-11-02 ENCOUNTER — Encounter: Payer: Medicare HMO | Admitting: Physical Therapy

## 2020-11-07 ENCOUNTER — Other Ambulatory Visit: Payer: Self-pay

## 2020-11-07 ENCOUNTER — Ambulatory Visit: Payer: Medicare HMO | Admitting: Occupational Therapy

## 2020-11-07 ENCOUNTER — Ambulatory Visit: Payer: Medicare HMO | Admitting: Physical Therapy

## 2020-11-07 ENCOUNTER — Encounter: Payer: Self-pay | Admitting: Physical Therapy

## 2020-11-07 DIAGNOSIS — M65312 Trigger thumb, left thumb: Secondary | ICD-10-CM

## 2020-11-07 DIAGNOSIS — M79642 Pain in left hand: Secondary | ICD-10-CM | POA: Diagnosis not present

## 2020-11-07 DIAGNOSIS — G8929 Other chronic pain: Secondary | ICD-10-CM | POA: Diagnosis not present

## 2020-11-07 DIAGNOSIS — M25642 Stiffness of left hand, not elsewhere classified: Secondary | ICD-10-CM | POA: Diagnosis not present

## 2020-11-07 DIAGNOSIS — M25512 Pain in left shoulder: Secondary | ICD-10-CM | POA: Diagnosis not present

## 2020-11-07 NOTE — Therapy (Signed)
[place] [facility] [address] [place], [address] Phone: [phone]   Fax:  [phone]  Physical Therapy Treatment  Patient Details  Name: Tiffany Wilcox MRN: [id number] Date of Birth: [birth year] No data recorded  Encounter Date: 11/07/2020   PT End of Session - 11/07/20 1516     Visit Number 8    Number of Visits 17    Date for PT Re-Evaluation 12/07/20    PT Start Time 1510    PT Stop Time 1553    PT Time Calculation (min) 43 min    Activity Tolerance Patient tolerated treatment well    Behavior During Therapy [facility] for tasks assessed/performed             History reviewed. No pertinent past medical history.  History reviewed. No pertinent surgical history.  There were no vitals filed for this visit.   Subjective Assessment - 11/07/20 1513     Subjective Pt reports to therapy with no complaints other than a little L shoulder stiffness and feeling a tired.She feels as if her L shoulder ROM is being maintained. She reports being active in HEP even on busy work days.    Pertinent History Pt is a 70 year old female presenting with L shoulder pain since Feb 2022. 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 Reports no MOI, just that she started noticing that her shoulder hurt 10/10 pain with overhead reaching. She reports her pain is a C-shape cap over her shoulder, and some on the posterior superior shoulder. She has had ultrasound imaging in April revealing "small RTC tear". Worst pain over the past couple weeks 8/10; best 3/10. Her pain is achy most of the time, but when she lifts her arm she has a sharp and burning. Denies popping/clicking or tingling/numbess. Pain is aggravated by laying on either side, overhead reaching, overhead lifting. Denies pain with pushing and pulling. 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 Enjoys gardening and crocheting and has been having trouble with this since her pain has started. She was completing [place] until Oct of 2021, during a grieving period, and tried to start again in Feb 2022, but her shoulder was too painful. She does report walking multiple times per week. She would like to return Tai Chi 3x/week and swimming 1x/week. Pt denies N/V, B&B changes, unexplained weight fluctuation, saddle paresthesia, fever, night sweats, or unrelenting night pain at this time. No falls in past 73mos.    Patient Stated Goals return to exercises             Therapeutic Exercise:     TRX AAROM Flex/ABD 2 x 60 sec   AAROM Dowel ER/IR 1 x 60sec  Bus drivers 5# 3 x 30 sec cues to keep elbows straight  Shoulder press # 10 GTOH 1 x 5 reps; 1 x 5 reps #5 cues to bend elbows and curl the weight close to body before pressing over head  Single arm bent over row with #5 DB 1 x 12 reps, 1 x 8 reps cues to keep elbow close and scap squeeze   Modified push up from wall 2 x 5 reps  Lat pull down #15 2 x 10 reps   ER GTB 2 x 60 sec cued to move slowly on recoil  PT Education - 11/07/20 1558     Education Details therex form/technique    Person(s) Educated Patient    Methods Explanation;Demonstration;Tactile cues;Verbal cues    Comprehension Verbalized understanding;Returned demonstration              PT Short Term Goals - 10/09/20 1311       PT SHORT TERM GOAL #1   Title Pt will be independent with HEP in order to improve strength and decrease pain in order to improve pain-free function at home and work.    Baseline 10/09/20 HEP given    Time 4    Period Weeks    Status New               PT Long Term Goals - 10/09/20 1312       PT LONG TERM GOAL #1   Title Pt will decrease worst pain as reported on NPRS by at least 3 points in order to demonstrate clinically significant reduction in pain.     Baseline 10/09/20 8/10    Time 8    Period Weeks    Status New      PT LONG TERM GOAL #2   Title Pt will demonstrate full L active shoulder ROM in order to complete household ADLs and self care to reflect PLOF    Baseline 10/09/20 flex: 80d abd: 72d ER: C5 IR: S1    Time 8    Period Weeks    Status New      PT LONG TERM GOAL #3   Title Patient will demonstrate all shoulder MMTs to at least 4+/5 in order to complete heavy household ADLs and return to PLOF    Baseline 10/09/20 flex: 2+ abd: 2+ ER: 4  IR: 4+    Time 8    Status New      PT LONG TERM GOAL #4   Title Patient will increase FOTO score to 67 to demonstrate predicted increase in functional mobility to complete ADLs    Baseline 10/09/20 50    Time 8    Period Weeks    Status New                   Plan - 11/07/20 1554     Clinical Impression Statement Pt tolerated session well evidenced by no increase in pain at end of session. Pt continues to improve with L shoulder AROM and WB tolerance this day. Pt needed multimodal cueing to perform therex with good carry over. Pt will continue to benefit from skilled therapy to address impairments and achieve set goals.    Personal Factors and Comorbidities Age;Sex;Fitness;Past/Current Experience;Time since onset of injury/illness/exacerbation    Examination-Activity Limitations Bathing;Reach Overhead;Lift;Carry;Sleep;Bed Mobility    Examination-Participation Restrictions Driving;Yard Work;Meal Prep;Cleaning;Laundry;Community Activity    PT Treatment/Interventions ADLs/Self Care Home Management;Iontophoresis 4mg /ml Dexamethasone;Therapeutic exercise;Passive range of motion;Joint Manipulations;Spinal Manipulations;Dry needling;Manual techniques;Neuromuscular re-education;Therapeutic activities;Moist Heat;Electrical Stimulation;Cryotherapy;Ultrasound;DME Instruction;Functional mobility training;Balance training;Patient/family education    PT Next Visit Plan TRX stretches, deltoid  strengthing, and long duration weighted stretching    PT Home Exercise Plan pulleys and UT stretch    Consulted and Agree with Plan of Care Patient             Patient will benefit from skilled therapeutic intervention in order to improve the following deficits and impairments:  Decreased coordination, Decreased endurance, Decreased range of motion, Decreased strength, Decreased mobility, Hypomobility, Increased fascial restricitons, Impaired UE functional use, Improper body mechanics, Pain, Postural dysfunction, Impaired flexibility, Increased  muscle spasms  Visit Diagnosis: Chronic left shoulder pain     Problem List There are no problems to display for this patient.   Durwin Reges DPT Sharion Settler, SPT  Durwin Reges 11/08/2020, 8:05 AM  [place] [facility] [address] [place], [address] Phone: [phone]   Fax:  [phone]  Name: Tiffany Wilcox MRN: [id number] Date of Birth: [birth year]

## 2020-11-07 NOTE — Therapy (Signed)
[facility] [facility] [address] [place], [address] Phone: [phone]   Fax:  [phone]  Occupational Therapy Treatment  Patient Details  Name: Tiffany Wilcox MRN: [id number] Date of Birth: 20-Aug-1950 Referring Provider (OT): Dr Lyn Hollingshead   Encounter Date: 11/07/2020   OT End of Session - 11/07/20 1813     Visit Number 9    Number of Visits 12    Date for OT Re-Evaluation 11/20/20    OT Start Time 1600    OT Stop Time 1645    OT Time Calculation (min) 45 min    Activity Tolerance Patient tolerated treatment well    Behavior During Therapy [facility] for tasks assessed/performed             No past medical history on file.  No past surgical history on file.  There were no vitals filed for this visit.   Subjective Assessment - 11/07/20 1747     Subjective  Doing well and much better - had only one day of locking of thumb and pain better - using it much more but aware what not to do    Patient Stated Goals Want my L thumb pain and motion better so I can crochet , work in the yard and use it like before    Currently in Pain? Yes    Pain Score 2     Pain Location Hand    Pain Orientation Left    Pain Descriptors / Indicators Tender    Pain Type Acute pain    Pain Onset More than a month ago               Pt show increase composite flexion of thumb and opposition - with less locking or clicking and pain  Some triggering only one day in week and tenderness down to 3/10 at the worse  Very little pain with use  Pt do have decrease MC flexion of thumb on bilateral hands Cont at home  contrast in the am and gentle PROM to IP and composite flexion of thumb  - for the day and maybe once or 2 again days if not as active Cont with  gripping object lightly but enlarge grips, loose grip and cup thumb just lightly around the hand      Had only 1 day of some clicking the last week        Skin check done  - no issues tolerating ionto - and pt to keep patch on for hour afterwards              OT Treatments/Exercises (OP) - 11/07/20 0001       Iontophoresis   Type of Iontophoresis Dexamethasone    Location L thumb A1pulley    Dose med patch , 2 current    Time 33                    OT Education - 11/07/20 1812     Education Details Ionto use and HEP changes    Person(s) Educated Patient    Methods Explanation;Verbal cues;Tactile cues;Demonstration;Handout    Comprehension Verbal cues required;Returned demonstration;Verbalized understanding              OT Short Term Goals - 10/09/20 1303       OT SHORT TERM GOAL #1   Title Pt to be indepedent in HEP to decrease pain, tendernes and triggering in L thumb  Baseline pain 4-7/10 and night time can increase to 10/10 , and tender - decrease AROM IP 60 and opposition - triggering several times at night time    Time 3    Period Weeks    Status New    Target Date 10/30/20               OT Long Term Goals - 10/09/20 1304       OT LONG TERM GOAL #1   Title L thumb AROM improve to WNL to use in ADL's without triggering    Baseline decrease RA and pain with flexion of thumb and opposition .Triggering worse nigh time    Time 5    Target Date 11/13/20      OT LONG TERM GOAL #2   Title Pt be independent in modifications to decrease risk for triggering and return to prior level of function    Baseline no knowledge on modifications and tasks that increase her risk for trigger thumb    Time 6    Period Weeks    Status New    Target Date 11/20/20                   Plan - 11/07/20 1813     Clinical Impression Statement Pt refer to OT with L non dominant trigger thumb. Pt pain at eval in thumb from 4-7/10 , tenderness over A1pulley 10/10 and at night time. Pain decrease to 0-3/10 tenderness but very little pain with use.  Pt  show increase flexion in thumb and opposition to base of 5th with less of  pull  - pt ed on start to grip objects lightly but enlarge them and light flexoin around the digits with thumb. Pt to cont use  modfications in tasks and immobilization to IP at night time and to decrease pain and increase AROM withtout triggering and pain - 9 th session of ionto done this date - tolerated well - pt limited in use of L hand in ADlL's and IADL's    OT Occupational Profile and History Problem Focused Assessment - Including review of records relating to presenting problem    Occupational performance deficits (Please refer to evaluation for details): ADL's;IADL's;Rest and Sleep;Play;Leisure;Social Participation    Body Structure / Function / Physical Skills ADL;Strength;Decreased knowledge of use of DME;Pain;UE functional use;ROM;IADL;Flexibility;FMC    Rehab Potential Fair    Clinical Decision Making Limited treatment options, no task modification necessary    Comorbidities Affecting Occupational Performance: None    Modification or Assistance to Complete Evaluation  No modification of tasks or assist necessary to complete eval    OT Frequency 2x / week    OT Duration 6 weeks    OT Treatment/Interventions Self-care/ADL training;Cryotherapy;Iontophoresis;Ultrasound;Therapeutic exercise;Contrast Bath;Splinting;DME and/or AE instruction;Passive range of motion;Manual Therapy    Consulted and Agree with Plan of Care Patient             Patient will benefit from skilled therapeutic intervention in order to improve the following deficits and impairments:   Body Structure / Function / Physical Skills: ADL, Strength, Decreased knowledge of use of DME, Pain, UE functional use, ROM, IADL, Flexibility, [facility]       Visit Diagnosis: Trigger thumb of left hand  Stiffness of left hand, not elsewhere classified  Pain in left hand    Problem List There are no problems to display for this patient.   Oletta Cohn OTR/L,CLT 11/07/2020, 6:16 PM  [place] [facility] PH[facility] [address] [place], [address] Phone: [phone]   Fax:  [phone]  Name: Tiffany Wilcox MRN: [id number] Date of Birth: 1951/03/11

## 2020-11-21 ENCOUNTER — Ambulatory Visit: Payer: Medicare HMO | Admitting: Physical Therapy

## 2020-11-21 ENCOUNTER — Ambulatory Visit: Payer: Medicare HMO | Attending: Sports Medicine | Admitting: Occupational Therapy

## 2020-11-21 DIAGNOSIS — M25642 Stiffness of left hand, not elsewhere classified: Secondary | ICD-10-CM

## 2020-11-21 DIAGNOSIS — M79642 Pain in left hand: Secondary | ICD-10-CM

## 2020-11-21 DIAGNOSIS — M65312 Trigger thumb, left thumb: Secondary | ICD-10-CM | POA: Diagnosis not present

## 2020-11-21 DIAGNOSIS — G8929 Other chronic pain: Secondary | ICD-10-CM | POA: Diagnosis not present

## 2020-11-21 DIAGNOSIS — M25512 Pain in left shoulder: Secondary | ICD-10-CM | POA: Diagnosis not present

## 2020-11-21 NOTE — Therapy (Signed)
[facility] [address] [place], [address] Phone: [phone]   Fax:  [phone]  Occupational Therapy Treatment/discharge  Patient Details  Name: Tiffany Wilcox MRN: [id number] Date of Birth: October 28, 1950 Referring Provider (OT): Dr Lyn Hollingshead   Encounter Date: 11/21/2020   OT End of Session - 11/21/20 1428     Visit Number 10    Number of Visits 12    Date for OT Re-Evaluation 11/21/20    OT Start Time 1355    OT Stop Time 1420    OT Time Calculation (min) 25 min    Activity Tolerance Patient tolerated treatment well    Behavior During Therapy [facility] for tasks assessed/performed             No past medical history on file.  No past surgical history on file.  There were no vitals filed for this visit.   Subjective Assessment - 11/21/20 1426     Subjective  I had good 2 wks - had one day that it bothered me - but I know what I did - tried to crochet for hour -and I do it tight - so I need to look and see if I can change the way I do it or just do shorter time -I have so many hobbies    Pertinent History L trigger thumb started about 2 months ago - her PCP refer her to DR Lyn Hollingshead -pt decline shot and refer to OT for L trigger thumb    Patient Stated Goals Want my L thumb pain and motion better so I can crochet , work in the yard and use it like before    Currently in Pain? No/denies                [facility] OT Assessment - 11/21/20 0001       Strength   Right Hand Grip (lbs) 45    Right Hand Lateral Pinch 11 lbs    Right Hand 3 Point Pinch 15 lbs    Left Hand Grip (lbs) 45    Left Hand Lateral Pinch 10 lbs    Left Hand 3 Point Pinch 6 lbs      Left Hand AROM   L Thumb MCP 0-60 35 Degrees    L Thumb IP 0-80 75 Degrees    L Thumb Radial ADduction/ABduction 0-55 60    L Thumb Palmar ADduction/ABduction 0-45 70                   Pt show increase composite flexion of thumb  and opposition - with now  locking or clicking and pain  MC still decrease by 5 degrees for flexion  triggering only one day in week and tenderness down to 3/10 at the worse Had only onetime triggering in the last 2 wks - pain 0/10 with use - tenderness decrease from 10/10to 2/10   Pt do have decrease MC flexion of thumb on bilateral hands Cont at home  contrast as needed when done to much or irritated thumb  Can do some light PROM to MC flexion of thumb   Cont with  gripping object lightly but enlarge grips, loose grip and cup thumb just lightly around the hand      Had only 1 day of some clicking the last 2 week  after she crochet for hour - pull to tight the wool  Pt okay to be discharge at this time  OT Education - 11/21/20 1428     Education Details HEP ad discharge instructions    Person(s) Educated Patient    Methods Explanation;Verbal cues;Tactile cues;Demonstration;Handout    Comprehension Verbal cues required;Returned demonstration;Verbalized understanding              OT Short Term Goals - 11/21/20 1433       OT SHORT TERM GOAL #1   Title Pt to be indepedent in HEP to decrease pain, tendernes and triggering in L thumb    Baseline pain 4-7/10 and night time can increase to 10/10 , and tender - decrease AROM IP 60 and opposition - triggering several times at night time NOW pain 0/10 with use, tenderness 2/10 - and triggering only one time the last 2 wks    Status Achieved               OT Long Term Goals - 11/21/20 1434       OT LONG TERM GOAL #1   Title L thumb AROM improve to WNL to use in ADL's without triggering    Baseline decrease RA and pain with flexion of thumb and opposition .Triggering worse nigh time - NOW AROM WNL and triggering only one time the last 2 wks    Status Achieved      OT LONG TERM GOAL #2   Title Pt be independent in modifications to decrease risk for triggering and return to prior level of function     Baseline no knowledge on modifications and tasks that increase her risk for trigger thumb - NOW has knowledge and modifying things-and trigger one time the last 2 wks    Status Achieved                   Plan - 11/21/20 1429     Clinical Impression Statement Pt refer to OT with L non dominant trigger thumb. Pt pain at eval in thumb from 4-7/10 , tenderness over A1pulley 10/10 and at night time. Pain decrease to 2/10 with tenderness over A1pulley and no pain with use- except one time in the last 2 wks when she tried to crochet for hour- pt cont to show AROM WNL except decrease MC flexion by 5 degrees.- grip and last grip WNL for her age and no pain - 3 point pinch still decrease and some discomfort on thumb IP   - pt to cont to modify activities by gripping objects lightly , using larger joints and enlarge  objects - pt met all goals and discharge at this time    OT Occupational Profile and History Problem Focused Assessment - Including review of records relating to presenting problem    Occupational performance deficits (Please refer to evaluation for details): ADL's;IADL's;Rest and Sleep;Play;Leisure;Social Participation    Body Structure / Function / Physical Skills ADL;Strength;Decreased knowledge of use of DME;Pain;UE functional use;ROM;IADL;Flexibility;FMC    Rehab Potential Fair    Clinical Decision Making Limited treatment options, no task modification necessary    Comorbidities Affecting Occupational Performance: None    Modification or Assistance to Complete Evaluation  No modification of tasks or assist necessary to complete eval    OT Treatment/Interventions Self-care/ADL training;Cryotherapy;Iontophoresis;Ultrasound;Therapeutic exercise;Contrast Bath;Splinting;DME and/or AE instruction;Passive range of motion;Manual Therapy    Consulted and Agree with Plan of Care Patient             Patient will benefit from skilled therapeutic intervention in order to improve the  following deficits and impairments:   Body Structure /  Function / Physical Skills: ADL, Strength, Decreased knowledge of use of DME, Pain, UE functional use, ROM, IADL, Flexibility, FMC       Visit Diagnosis: Trigger thumb of left hand  Stiffness of left hand, not elsewhere classified  Pain in left hand    Problem List There are no problems to display for this patient.   Rosalyn Gess OTR/L,CLT 11/21/2020, 2:36 PM  [facility] SPORTS MEDIC[facility] [address], [place], [address] Phone: [phone]   Fax:  [phone]  Name: Naika Noto MRN: [id number] Date of Birth: [birth year]

## 2020-11-22 ENCOUNTER — Ambulatory Visit: Payer: Medicare HMO | Admitting: Physical Therapy

## 2020-11-22 ENCOUNTER — Encounter: Payer: Self-pay | Admitting: Physical Therapy

## 2020-11-22 DIAGNOSIS — M25642 Stiffness of left hand, not elsewhere classified: Secondary | ICD-10-CM

## 2020-11-22 DIAGNOSIS — M65312 Trigger thumb, left thumb: Secondary | ICD-10-CM | POA: Diagnosis not present

## 2020-11-22 DIAGNOSIS — G8929 Other chronic pain: Secondary | ICD-10-CM | POA: Diagnosis not present

## 2020-11-22 DIAGNOSIS — M25512 Pain in left shoulder: Secondary | ICD-10-CM | POA: Diagnosis not present

## 2020-11-22 DIAGNOSIS — M79642 Pain in left hand: Secondary | ICD-10-CM | POA: Diagnosis not present

## 2020-11-22 NOTE — Therapy (Signed)
[facility] [address], [place], [address] Phone: [phone]   Fax:  [phone]  Physical Therapy Treatment  Patient Details  Name: Tiffany Wilcox MRN: [id number] Date of Birth: 10-15-1950 No data recorded  Encounter Date: 11/22/2020   PT End of Session - 11/22/20 1436     Visit Number 9    Number of Visits 17    Date for PT Re-Evaluation 12/07/20    PT Start Time 2505    PT Stop Time 1510    PT Time Calculation (min) 41 min    Activity Tolerance Patient tolerated treatment well    Behavior During Therapy [facility] for tasks assessed/performed             History reviewed. No pertinent past medical history.  History reviewed. No pertinent surgical history.  There were no vitals filed for this visit.   Subjective Assessment - 11/22/20 1433     Subjective Pt reports to therapy with 0/10 pain at rest and 5/10 pain at end range flex/abd in L shoulder. She reports that her ROM in both shoulders.    Pertinent History Pt is a 70 year old female presenting with L shoulder pain since Feb 2022. 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 Reports no MOI, just that she started noticing that her shoulder hurt 10/10 pain with overhead reaching. She reports her pain is a C-shape cap over her shoulder, and some on the posterior superior shoulder. She has had ultrasound imaging in April revealing "small RTC tear". Worst pain over the past couple weeks 8/10; best 3/10. Her pain is achy most of the time, but when she lifts her arm she has a sharp and burning. Denies popping/clicking or tingling/numbess. Pain is aggravated by laying on either side, overhead reaching, overhead lifting. Denies pain with pushing and pulling. She is retired, works part time as a Armed forces training and education officer, her job duties does not bother her shoulder, but she is mindful when she carries files. Enjoys gardening and crocheting and has been having  trouble with this since her pain has started. She was completing [place] until Oct of 2021, during a grieving period, and tried to start again in Feb 2022, but her shoulder was too painful. She does report walking multiple times per week. She would like to return Tai Chi 3x/week and swimming 1x/week. Pt denies N/V, B&B changes, unexplained weight fluctuation, saddle paresthesia, fever, night sweats, or unrelenting night pain at this time. No falls in past 11mos.    Limitations Lifting;House hold activities             Therex  UBE L1 x 11min FWD/BWD  TRX AAROM Flex/ABD Walk outs 1 x 8 with hold at end range 3-5 sec cues to relax shoulder and turn R shoulder back in abduction stretch   Shoulder press # 5 GTOH 2 x 10 reps #5 cues to bend elbows and curl the weight close to body before pressing over head *cues to set shoulder blades back when bending down  Shoulder Flexion > Lower in Abduction #2 2 x 8 reps cues to correct sequencing and shoulder hiking  Y lift on wall #2 1 x 5 reps, w/o wt 1 x 10 reps  High Row GTB 2 x 10 reps cues squeeze shoulders together  Low Row GTB 2 x 10 reps cues squeeze shoulder blades together  PT reviewed the following HEP with patient with patient able to demonstrate a set  of the following with min cuing for correction needed. PT educated patient on parameters of therex (how/when to inc/decrease intensity, frequency, rep/set range, stretch hold time, and purpose of therex) with verbalized understanding.  Access Code: [email or web address] Standing Shoulder Abduction with Dumbbells - 1 x daily - 2-3 x weekly - 3 sets - 10 reps Standing Full Range Shoulder Flexion with Dumbbells - 1 x daily - 2-3 x weekly - 3 sets - 10 reps Low Trap Setting at Wall - 1 x daily - 2-3 x weekly - 3 sets - 10 reps Standing High Row with Resistance - 1 x daily - 2-3 x weekly - 3 sets - 10 reps Standing Shoulder Row with Anchored Resistance - 1 x daily - 2-3 x weekly - 3 sets - 10 reps                             PT Education - 11/22/20 1525     Education Details therex form/ technique    Person(s) Educated Patient    Methods Explanation;Demonstration    Comprehension Verbalized understanding;Returned demonstration              PT Short Term Goals - 10/09/20 1311       PT SHORT TERM GOAL #1   Title Pt will be independent with HEP in order to improve strength and decrease pain in order to improve pain-free function at home and work.    Baseline 10/09/20 HEP given    Time 4    Period Weeks    Status New               PT Long Term Goals - 10/09/20 1312       PT LONG TERM GOAL #1   Title Pt will decrease worst pain as reported on NPRS by at least 3 points in order to demonstrate clinically significant reduction in pain.    Baseline 10/09/20 8/10    Time 8    Period Weeks    Status New      PT LONG TERM GOAL #2   Title Pt will demonstrate full L active shoulder ROM in order to complete household ADLs and self care to reflect PLOF    Baseline 10/09/20 flex: 80d abd: 72d ER: C5 IR: S1    Time 8    Period Weeks    Status New      PT LONG TERM GOAL #3   Title Patient will demonstrate all shoulder MMTs to at least 4+/5 in order to complete heavy household ADLs and return to PLOF    Baseline 10/09/20 flex: 2+ abd: 2+ ER: 4  IR: 4+    Time 8    Status New      PT LONG TERM GOAL #4   Title Patient will increase FOTO score to 67 to demonstrate predicted increase in functional mobility to complete ADLs    Baseline 10/09/20 50    Time 8    Period Weeks    Status New                   Plan - 11/22/20 1526     Clinical Impression Statement Pt tolerated session well evidenced by no increase in pain following therex. Pt continues to improve with L shoulder AROM and overhead activity tolerance this day. Pt needed multimodal cueing to perform therex with good carry over. Pt will continue to benefit from skilled therapy to address  impairments and achieve set goals.    Personal Factors and Comorbidities Age;Sex;Fitness;Past/Current Experience;Time since onset of injury/illness/exacerbation    Examination-Activity Limitations Bathing;Reach Overhead;Lift;Carry;Sleep;Bed Mobility    Examination-Participation Restrictions Driving;Yard Work;Meal Prep;Cleaning;Laundry;Community Activity    Stability/Clinical Decision Making Evolving/Moderate complexity    Rehab Potential Good    PT Frequency 2x / week    PT Duration 8 weeks    PT Treatment/Interventions ADLs/Self Care Home Management;Iontophoresis 4mg /ml Dexamethasone;Therapeutic exercise;Passive range of motion;Joint Manipulations;Spinal Manipulations;Dry needling;Manual techniques;Neuromuscular re-education;Therapeutic activities;Moist Heat;Electrical Stimulation;Cryotherapy;Ultrasound;DME Instruction;Functional mobility training;Balance training;Patient/family education    PT Next Visit Plan TRX stretches, deltoid strengthing, and long duration weighted stretching    PT Home Exercise Plan shoulder abd, flex, Y wall lifts, high/low row    Consulted and Agree with Plan of Care Patient             Patient will benefit from skilled therapeutic intervention in order to improve the following deficits and impairments:  Decreased coordination, Decreased endurance, Decreased range of motion, Decreased strength, Decreased mobility, Hypomobility, Increased fascial restricitons, Impaired UE functional use, Improper body mechanics, Pain, Postural dysfunction, Impaired flexibility, Increased muscle spasms  Visit Diagnosis: Stiffness of left hand, not elsewhere classified     Problem List There are no problems to display for this patient.   Durwin Reges DPT Sharion Settler, SPT  Durwin Reges 11/22/2020, 4:12 PM  [place] [facility] [address] [place], [address] Phone: [phone]   Fax:   [phone]  Name: Tiffany Wilcox MRN: [id number] Date of Birth: 1950-10-26

## 2020-11-23 ENCOUNTER — Encounter: Payer: Medicare HMO | Admitting: Physical Therapy

## 2020-11-28 ENCOUNTER — Other Ambulatory Visit: Payer: Self-pay

## 2020-11-28 ENCOUNTER — Encounter: Payer: Self-pay | Admitting: Physical Therapy

## 2020-11-28 ENCOUNTER — Ambulatory Visit: Payer: Medicare HMO | Admitting: Physical Therapy

## 2020-11-28 DIAGNOSIS — M25512 Pain in left shoulder: Secondary | ICD-10-CM

## 2020-11-28 DIAGNOSIS — G8929 Other chronic pain: Secondary | ICD-10-CM

## 2020-11-28 DIAGNOSIS — M25642 Stiffness of left hand, not elsewhere classified: Secondary | ICD-10-CM | POA: Diagnosis not present

## 2020-11-28 DIAGNOSIS — M79642 Pain in left hand: Secondary | ICD-10-CM | POA: Diagnosis not present

## 2020-11-28 DIAGNOSIS — M65312 Trigger thumb, left thumb: Secondary | ICD-10-CM | POA: Diagnosis not present

## 2020-11-28 NOTE — Therapy (Signed)
[place] [facility] [address] [place], [address] Phone: [phone]   Fax:  [phone]  Physical Therapy Treatment  Patient Details  Name: Tiffany Wilcox MRN: [id number] Date of Birth: 1950/06/28 No data recorded  Encounter Date: 11/28/2020   PT End of Session - 11/28/20 1319     Visit Number 10    Number of Visits 17    Date for PT Re-Evaluation 12/07/20    PT Start Time 1301    PT Stop Time 1341    PT Time Calculation (min) 40 min    Activity Tolerance Patient tolerated treatment well    Behavior During Therapy [facility] for tasks assessed/performed             History reviewed. No pertinent past medical history.  History reviewed. No pertinent surgical history.  There were no vitals filed for this visit.   Subjective Assessment - 11/28/20 1305     Subjective Pt reports no pain, and that she is impressed with how far she has come. Pt reports she feels like she is near ready to d/c from PT.    Pertinent History Pt is a 70 year old female presenting with L shoulder pain since Feb 2022. 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 Reports no MOI, just that she started noticing that her shoulder hurt 10/10 pain with overhead reaching. She reports her pain is a C-shape cap over her shoulder, and some on the posterior superior shoulder. She has had ultrasound imaging in April revealing "small RTC tear". Worst pain over the past couple weeks 8/10; best 3/10. Her pain is achy most of the time, but when she lifts her arm she has a sharp and burning. Denies popping/clicking or tingling/numbess. Pain is aggravated by laying on either side, overhead reaching, overhead lifting. Denies pain with pushing and pulling. She is retired, works part time as a Armed forces training and education officer, her job duties does not bother her shoulder, but she is mindful when she carries files. Enjoys gardening and crocheting and has been having trouble  with this since her pain has started. She was completing [employer] until Oct of 2021, during a grieving period, and tried to start again in Feb 2022, but her shoulder was too painful. She does report walking multiple times per week. She would like to return Tai Chi 3x/week and swimming 1x/week. Pt denies N/V, B&B changes, unexplained weight fluctuation, saddle paresthesia, fever, night sweats, or unrelenting night pain at this time. No falls in past 28mos.    Limitations Lifting;House hold activities    How long can you sit comfortably? unlimited    How long can you stand comfortably? unlimited    How long can you walk comfortably? unlimited    Diagnostic tests dianostic [place] in April 2022    Pain Onset More than a month ago               Therex   UBE L1 x 79min FWD/BWD   TRX AAROM Flex/ABD Walk outs x12 with hold at end range 3-5 sec cues to relax shoulder and turn R shoulder back in abduction stretch   Lat stretch 2x 30sec hold (added to HEP)  Lat pulldown 20# 2x 10 with min cuing initially for technique with good carry over   Manual STM with trigger point release to latissimus. Following: Dry Needling: (2) 35mm .25 needles placed along the L latissimus to decrease increased muscular spasms and trigger points with the  patient positioned in supine. Patient was educated on risks and benefits of therapy and verbally consents to PT.   G3 inf GHJ mobilization w/ flexion 3x 30sec bouts G3 post GHJ mobilization w/ abd 3x 30sec bouts                          PT Education - 11/28/20 1338     Education Details therex/form/ technique    Person(s) Educated Patient    Methods Explanation;Verbal cues;Demonstration    Comprehension Verbalized understanding;Returned demonstration;Verbal cues required              PT Short Term Goals - 11/28/20 1307       PT SHORT TERM GOAL #1   Title Pt will be independent with HEP in order to improve strength and decrease pain in  order to improve pain-free function at home and work.    Baseline 10/09/20 HEP given; 11/28/20 Completing new HEP with no issues    Time 4    Period Weeks    Status Achieved               PT Long Term Goals - 11/28/20 1308       PT LONG TERM GOAL #1   Title Pt will decrease worst pain as reported on NPRS by at least 3 points in order to demonstrate clinically significant reduction in pain.    Baseline 10/09/20 8/10; 11/30/20 0/10 pain    Time 8    Period Weeks    Status Achieved      PT LONG TERM GOAL #2   Title Pt will demonstrate full L active shoulder ROM in order to complete household ADLs and self care to reflect PLOF    Baseline 10/09/20 flex: 80d abd: 72d ER: C5 IR: S1; flex 167d; abd 152d ER C7 IR: T10    Time 8    Period Weeks    Status Achieved      PT LONG TERM GOAL #3   Title Patient will demonstrate all shoulder MMTs to at least 4+/5 in order to complete heavy household ADLs and return to PLOF    Baseline 10/09/20 flex: 2+ abd: 2+ ER: 4  IR: 4+; 11/28/20 flex: 5 abd: 4+ ER: 5  IR: 5    Time 8    Period Weeks    Status Achieved      PT LONG TERM GOAL #4   Title Patient will increase FOTO score to 67 to demonstrate predicted increase in functional mobility to complete ADLs    Baseline 10/09/20 50; 11/28/20 68    Time 8    Period Weeks                   Plan - 11/28/20 1320     Clinical Impression Statement PT reassessed goals this session per 10th visit, where patient has most goals, is continuing to lack last 20~ degrees of overhead flexion and abduction. Tension at latissimus inhibiting 10d of overhead motion, with 10d increase in overhead flex and abd following TDN and manual techniques. PT continued therex progression for increased overhead mobility with success. PT will continue progression as able.    Personal Factors and Comorbidities Age;Sex;Fitness;Past/Current Experience;Time since onset of injury/illness/exacerbation    Examination-Activity  Limitations Bathing;Reach Overhead;Lift;Carry;Sleep;Bed Mobility    Examination-Participation Restrictions Driving;Yard Work;Meal Prep;Cleaning;Laundry;Community Activity    Stability/Clinical Decision Making Evolving/Moderate complexity    Clinical Decision Making Moderate    Rehab Potential Good  PT Frequency 2x / week    PT Duration 8 weeks    PT Treatment/Interventions ADLs/Self Care Home Management;Iontophoresis 4mg /ml Dexamethasone;Therapeutic exercise;Passive range of motion;Joint Manipulations;Spinal Manipulations;Dry needling;Manual techniques;Neuromuscular re-education;Therapeutic activities;Moist Heat;Electrical Stimulation;Cryotherapy;Ultrasound;DME Instruction;Functional mobility training;Balance training;Patient/family education    PT Next Visit Plan TRX stretches, deltoid strengthing, and long duration weighted stretching    PT Home Exercise Plan shoulder abd, flex, Y wall lifts, high/low row    Consulted and Agree with Plan of Care Patient             Patient will benefit from skilled therapeutic intervention in order to improve the following deficits and impairments:  Decreased coordination, Decreased endurance, Decreased range of motion, Decreased strength, Decreased mobility, Hypomobility, Increased fascial restricitons, Impaired UE functional use, Improper body mechanics, Pain, Postural dysfunction, Impaired flexibility, Increased muscle spasms  Visit Diagnosis: Chronic left shoulder pain     Problem List There are no problems to display for this patient.  Durwin Reges DPT Durwin Reges 11/28/2020, 1:46 PM  [place] [facility] [address], [place], [address] Phone: [phone]   Fax:  [phone]  Name: Tiffany Wilcox MRN: [id number] Date of Birth: 1951-01-11

## 2020-11-30 ENCOUNTER — Encounter: Payer: Medicare HMO | Admitting: Physical Therapy

## 2020-12-05 ENCOUNTER — Ambulatory Visit: Payer: Medicare HMO | Admitting: Physical Therapy

## 2020-12-05 ENCOUNTER — Encounter: Payer: Self-pay | Admitting: Physical Therapy

## 2020-12-05 DIAGNOSIS — G8929 Other chronic pain: Secondary | ICD-10-CM

## 2020-12-05 DIAGNOSIS — M25512 Pain in left shoulder: Secondary | ICD-10-CM | POA: Diagnosis not present

## 2020-12-05 DIAGNOSIS — M25642 Stiffness of left hand, not elsewhere classified: Secondary | ICD-10-CM | POA: Diagnosis not present

## 2020-12-05 DIAGNOSIS — M65312 Trigger thumb, left thumb: Secondary | ICD-10-CM | POA: Diagnosis not present

## 2020-12-05 DIAGNOSIS — M79642 Pain in left hand: Secondary | ICD-10-CM | POA: Diagnosis not present

## 2020-12-05 NOTE — Therapy (Signed)
[place] [facility] [address], [place], [address] Phone: [phone]   Fax:  [phone]  Physical Therapy Treatment Discharge Summary Reporting Period 11/28/2020-12/05/2020   Patient Details  Name: Tiffany Wilcox MRN: [id number] Date of Birth: August 05, 1950 No data recorded  Encounter Date: 12/05/2020   PT End of Session - 12/05/20 1452     Visit Number 11    Number of Visits 17    Date for PT Re-Evaluation 12/07/20    PT Start Time 0258    PT Stop Time 1440    PT Time Calculation (min) 12 min    Activity Tolerance Patient tolerated treatment well    Behavior During Therapy [facility] for tasks assessed/performed             History reviewed. No pertinent past medical history.  History reviewed. No pertinent surgical history.  There were no vitals filed for this visit.   Subjective Assessment - 12/05/20 1433     Subjective Pt reports no pain in L shoulder until at the very end of her range. She states she feels comfortable and independent with shoulder strengthening progression.    Pertinent History Pt is a 70 year old female presenting with L shoulder pain since Feb 2022. 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 Reports no MOI, just that she started noticing that her shoulder hurt 10/10 pain with overhead reaching. She reports her pain is a C-shape cap over her shoulder, and some on the posterior superior shoulder. She has had ultrasound imaging in April revealing "small RTC tear". Worst pain over the past couple weeks 8/10; best 3/10. Her pain is achy most of the time, but when she lifts her arm she has a sharp and burning. Denies popping/clicking or tingling/numbess. Pain is aggravated by laying on either side, overhead reaching, overhead lifting. Denies pain with pushing and pulling. She is retired, works part time as a Armed forces training and education officer, her job duties does not bother her shoulder, but she is mindful  when she carries files. Enjoys gardening and crocheting and has been having trouble with this since her pain has started. She was completing [place] until Oct of 2021, during a grieving period, and tried to start again in Feb 2022, but her shoulder was too painful. She does report walking multiple times per week. She would like to return Tai Chi 3x/week and swimming 1x/week. Pt denies N/V, B&B changes, unexplained weight fluctuation, saddle paresthesia, fever, night sweats, or unrelenting night pain at this time. No falls in past 18mos.    Limitations Lifting;House hold activities    How long can you sit comfortably? unlimited    How long can you stand comfortably? unlimited    How long can you walk comfortably? unlimited    Diagnostic tests dianostic [place] in April 2022    Patient Stated Goals return to exercises               Therex  UBE FWD/BWD 2 min  [place] form and technique review cues to squat, curl, and press overhead.  Patient appropriate for discharge with given HEP to continue progression of strength and functional mobility. All patient goals achieved. Please refer to clinical impression and goals section for specifics updates.                           PT Education - 12/05/20 1436     Education Details therex form/technique  Person(s) Educated Patient    Methods Explanation;Demonstration    Comprehension Verbalized understanding;Returned demonstration              PT Short Term Goals - 11/28/20 1307       PT SHORT TERM GOAL #1   Title Pt will be independent with HEP in order to improve strength and decrease pain in order to improve pain-free function at home and work.    Baseline 10/09/20 HEP given; 11/28/20 Completing new HEP with no issues    Time 4    Period Weeks    Status Achieved               PT Long Term Goals - 12/05/20 1453       PT LONG TERM GOAL #1   Title Pt will decrease worst pain as reported on NPRS by at  least 3 points in order to demonstrate clinically significant reduction in pain.    Baseline 10/09/20 8/10; 11/30/20 0/10 pain    Time 8    Period Weeks    Status Achieved      PT LONG TERM GOAL #2   Title Pt will demonstrate full L active shoulder ROM in order to complete household ADLs and self care to reflect PLOF    Baseline 10/09/20 flex: 80d abd: 72d ER: C5 IR: S1; flex 167d; abd 152d ER C7 IR: T10    Period Weeks    Status Achieved      PT LONG TERM GOAL #3   Title Patient will demonstrate all shoulder MMTs to at least 4+/5 in order to complete heavy household ADLs and return to PLOF    Baseline 10/09/20 flex: 2+ abd: 2+ ER: 4  IR: 4+; 11/28/20 flex: 5 abd: 4+ ER: 5  IR: 5    Time 8    Period Weeks    Status Achieved      PT LONG TERM GOAL #4   Title Patient will increase FOTO score to 67 to demonstrate predicted increase in functional mobility to complete ADLs    Baseline 10/09/20 50; 11/28/20 68    Time 8    Period Weeks    Status Achieved                   Plan - 12/05/20 1441     Clinical Impression Statement Patient appropriate for discharge this date. PT addressed HEP and therex form/technique questions. Pt has achieved all set goals and has demonstrated ability to independently progress L shoulder strengthening and mobility with given HEP. End of PT POC for this episode    Personal Factors and Comorbidities Age;Sex;Fitness;Past/Current Experience;Time since onset of injury/illness/exacerbation    Examination-Activity Limitations Bathing;Reach Overhead;Lift;Carry;Sleep;Bed Mobility    Examination-Participation Restrictions Driving;Yard Work;Meal Prep;Cleaning;Laundry;Community Activity    Stability/Clinical Decision Making Evolving/Moderate complexity    Clinical Decision Making Moderate    Rehab Potential Good    PT Frequency 2x / week    PT Duration 8 weeks    PT Treatment/Interventions ADLs/Self Care Home Management;Iontophoresis 4mg /ml  Dexamethasone;Therapeutic exercise;Passive range of motion;Joint Manipulations;Spinal Manipulations;Dry needling;Manual techniques;Neuromuscular re-education;Therapeutic activities;Moist Heat;Electrical Stimulation;Cryotherapy;Ultrasound;DME Instruction;Functional mobility training;Balance training;Patient/family education    Consulted and Agree with Plan of Care Patient             Patient will benefit from skilled therapeutic intervention in order to improve the following deficits and impairments:  Decreased coordination, Decreased endurance, Decreased range of motion, Decreased strength, Decreased mobility, Hypomobility, Increased fascial restricitons, Impaired UE functional use,  Improper body mechanics, Pain, Postural dysfunction, Impaired flexibility, Increased muscle spasms  Visit Diagnosis: Chronic left shoulder pain     Problem List There are no problems to display for this patient.  Durwin Reges DPT Sharion Settler, SPT  Durwin Reges 12/05/2020, 3:59 PM  [place] [facility] [address], [place], [address] Phone: [phone]   Fax:  [phone]  Name: Tiffany Wilcox MRN: [id number] Date of Birth: 1950-10-14

## 2021-02-06 ENCOUNTER — Other Ambulatory Visit: Payer: Self-pay

## 2021-02-06 ENCOUNTER — Ambulatory Visit
Admission: RE | Admit: 2021-02-06 | Discharge: 2021-02-06 | Disposition: A | Discharge status: Home / Self Care | Payer: Medicare HMO | Source: Ambulatory Visit | Attending: Family Medicine | Admitting: Family Medicine

## 2021-02-06 DIAGNOSIS — Z13820 Encounter for screening for osteoporosis: Secondary | ICD-10-CM

## 2021-02-06 DIAGNOSIS — Z780 Asymptomatic menopausal state: Secondary | ICD-10-CM | POA: Diagnosis not present

## 2021-02-06 DIAGNOSIS — M8589 Other specified disorders of bone density and structure, multiple sites: Secondary | ICD-10-CM | POA: Diagnosis not present

## 2021-02-06 DIAGNOSIS — E2839 Other primary ovarian failure: Secondary | ICD-10-CM

## 2021-08-07 DIAGNOSIS — E785 Hyperlipidemia, unspecified: Secondary | ICD-10-CM | POA: Diagnosis not present

## 2021-08-07 DIAGNOSIS — I10 Essential (primary) hypertension: Secondary | ICD-10-CM | POA: Diagnosis not present

## 2021-08-07 DIAGNOSIS — Z0000 Encounter for general adult medical examination without abnormal findings: Secondary | ICD-10-CM | POA: Diagnosis not present

## 2021-08-07 DIAGNOSIS — Z789 Other specified health status: Secondary | ICD-10-CM | POA: Diagnosis not present

## 2021-08-07 DIAGNOSIS — E2839 Other primary ovarian failure: Secondary | ICD-10-CM | POA: Diagnosis not present

## 2021-09-07 DIAGNOSIS — Z87891 Personal history of nicotine dependence: Secondary | ICD-10-CM | POA: Diagnosis not present

## 2021-09-07 DIAGNOSIS — Z8249 Family history of ischemic heart disease and other diseases of the circulatory system: Secondary | ICD-10-CM | POA: Diagnosis not present

## 2021-09-07 DIAGNOSIS — Z803 Family history of malignant neoplasm of breast: Secondary | ICD-10-CM | POA: Diagnosis not present

## 2021-09-07 DIAGNOSIS — Z825 Family history of asthma and other chronic lower respiratory diseases: Secondary | ICD-10-CM | POA: Diagnosis not present

## 2021-09-07 DIAGNOSIS — Z833 Family history of diabetes mellitus: Secondary | ICD-10-CM | POA: Diagnosis not present

## 2021-09-07 DIAGNOSIS — Z008 Encounter for other general examination: Secondary | ICD-10-CM | POA: Diagnosis not present

## 2022-03-26 DIAGNOSIS — R051 Acute cough: Secondary | ICD-10-CM | POA: Diagnosis not present

## 2022-07-27 ENCOUNTER — Encounter: Payer: Self-pay | Admitting: Emergency Medicine

## 2022-07-27 ENCOUNTER — Ambulatory Visit
Admission: EM | Admit: 2022-07-27 | Discharge: 2022-07-27 | Disposition: A | Discharge status: Home / Self Care | Payer: Medicare HMO | Attending: Family Medicine | Admitting: Family Medicine

## 2022-07-27 ENCOUNTER — Ambulatory Visit (INDEPENDENT_AMBULATORY_CARE_PROVIDER_SITE_OTHER): Payer: Medicare HMO

## 2022-07-27 DIAGNOSIS — R109 Unspecified abdominal pain: Secondary | ICD-10-CM

## 2022-07-27 DIAGNOSIS — M25512 Pain in left shoulder: Secondary | ICD-10-CM

## 2022-07-27 DIAGNOSIS — R0781 Pleurodynia: Secondary | ICD-10-CM | POA: Diagnosis not present

## 2022-07-27 DIAGNOSIS — I10 Essential (primary) hypertension: Secondary | ICD-10-CM

## 2022-07-27 DIAGNOSIS — W19XXXA Unspecified fall, initial encounter: Secondary | ICD-10-CM

## 2022-07-27 MED ORDER — KETOROLAC TROMETHAMINE 60 MG/2ML IM SOLN
30.0000 mg | Freq: Once | INTRAMUSCULAR | Status: AC
  Administered 2022-07-27: 30 mg via INTRAMUSCULAR

## 2022-07-27 MED ORDER — ONDANSETRON 4 MG PO TBDP
4.0000 mg | ORAL_TABLET | Freq: Once | ORAL | Status: AC
  Administered 2022-07-27: 4 mg via ORAL

## 2022-07-27 MED ORDER — TRAMADOL HCL 50 MG PO TABS: 50.0000 mg | ORAL_TABLET | Freq: Four times a day (QID) | ORAL | 0 refills | Status: AC | PRN

## 2022-07-27 MED ORDER — METHOCARBAMOL 500 MG PO TABS: 500.0000 mg | ORAL_TABLET | Freq: Two times a day (BID) | ORAL | 0 refills | Status: AC

## 2022-07-27 NOTE — Discharge Instructions (Signed)
You didn't break anything. Stop by the pharmacy to pick up your prescriptions.  Do not drive or operate heavy machinery while taking tramadol.  Your pain should gradually improve over the next 1 to 2 weeks.  If your pain does not improve, schedule follow-up visit with your orthopedic provider.   Follow up with your primary care provider if your blood pressure does not improve after your pain is better controlled.

## 2022-07-27 NOTE — ED Provider Notes (Incomplete)
[facility]    CSN: [id number] Arrival date & time: 07/27/22  1305      History   Chief Complaint Chief Complaint  Patient presents with   Fall   Rib Pain    HPI  HPI Tiffany Wilcox is a 72 y.o. female.   Idap presents after a fall prior to arrival. She was in the yeard and stepped backwards and fell hitting low back and left sided ribs. She did not hit her head or loss of consciousness.  Heat made it worse but ice numbness it   Has radiating pain arounds.  She feels like She has abdomina pain    low back pain that started *** days ago after ***?injury. Pain is described as *** {back pain desc:31852} and {back pain radiation:11559}. Pain rated ***/10.  Endorses {ed back pain sx:10963}. Denies {ed back pain sx:10963}.   Continues to have *** description pain with movement. Tonia Ghent ***does not feel like his*** legs are weak.   Has ***never injured his/her*** back before.   Has tried {home care:60200} with {relief:12621}.  ***No change in pain day vs night.    Red flags*** Perianal numbness, cancer, unexplained weight loss, immunosuppression, prolonged use of steroids, history of IV drug use, pain that is increased or unrelieved by rest, fever, bladder or bowel incontinence, significant trauma related to age   Fever : no  Weight loss: *** Perianal numbness: *** Bowel incontinence: *** Bladder incontinence: *** Trauma: ***  Sore throat: no   Cough: no Hydration: normal  Abdominal pain: no Nausea: no Vomiting: no Abdominal pain: *** Dysuria:*** Sleep disturbance: no *** Neck Pain: no Headache: no     History reviewed. No pertinent past medical history.  There are no problems to display for this patient.   Past Surgical History:  Procedure Laterality Date   ABDOMINAL HYSTERECTOMY     APPENDECTOMY     TONSILLECTOMY      OB History   No obstetric history on file.      Home Medications    Prior to Admission medications   Not on File     Family History History reviewed. No pertinent family history.  Social History Social History   Tobacco Use   Smoking status: Former    Types: Cigarettes   Smokeless tobacco: Never  Vaping Use   Vaping Use: Never used  Substance Use Topics   Alcohol use: Yes   Drug use: Never     Allergies   Codeine   Review of Systems Review of Systems: egative unless otherwise stated in HPI.      Physical Exam Triage Vital Signs ED Triage Vitals  Enc Vitals Group     BP 07/27/22 1326 (!) 175/112     Pulse Rate 07/27/22 1326 90     Resp 07/27/22 1326 14     Temp 07/27/22 1326 98.1 F (36.7 C)     Temp Source 07/27/22 1326 Oral     SpO2 07/27/22 1326 100 %     Weight 07/27/22 1322 148 lb (67.1 kg)     Height 07/27/22 1322 5\' 4"  (1.626 m)     Head Circumference --      Peak Flow --      Pain Score 07/27/22 1322 10     Pain Loc --      Pain Edu? --      Excl. in [place]? --    No data found.  Updated Vital Signs BP (!) 175/112 (BP Location:  Right Arm)   Pulse 90   Temp 98.1 F (36.7 C) (Oral)   Resp 14   Ht 5\' 4"  (1.626 m)   Wt 67.1 kg   SpO2 100%   BMI 25.40 kg/m   Visual Acuity Right Eye Distance:   Left Eye Distance:   Bilateral Distance:    Right Eye Near:   Left Eye Near:    Bilateral Near:     Physical Exam GEN: well appearing female in no acute distress  CVS: well perfused  RESP: speaking in full sentences without pause, no respiratory distress  MSK:  Lumbar spine: - Inspection: no gross deformity or asymmetry, swelling or ecchymosis. No skin changes  - Palpation: No TTP over the spinous processes, ***bilateral lumbar paraspinal muscles, no SI joint tenderness bilaterally - ROM: full active ROM of the lumbar spine in flexion and extension but with mild pain ***with flexion/extension  - Strength: 5/5 strength of lower extremity in L4-S1 nerve root distributions b/l - Neuro: sensation intact in the L4-S1 nerve root distribution b/l, ***2+ L4 and S1  reflexes - Special testing: Negative straight leg raise SKIN: warm, dry, no overly skin rash or erythema    UC Treatments / Results  Labs (all labs ordered are listed, but only abnormal results are displayed) Labs Reviewed - No data to display  EKG   Radiology No results found.  Procedures Procedures (including critical care time)  Medications Ordered in UC Medications  ondansetron (ZOFRAN-ODT) disintegrating tablet 4 mg (4 mg Oral Given 07/27/22 1327)    Initial Impression / Assessment and Plan / UC Course  I have reviewed the triage vital signs and the nursing notes.  Pertinent labs & imaging results that were available during my care of the patient were reviewed by me and considered in my medical decision making (see chart for details).      Pt is a 72 y.o.  female with *** days of *** back pain after ***.  Obtained *** plain films.  Exam is concerning for a ***.   Xray personally reviewed by me were ***unremarkable for fracture, ***malalignment or dislocation.  Radiologist agrees***/notes ***  Patient to gradually return to normal activities, as tolerated and continue ordinary activities within the limits permitted by pain. Prescribed Naproxen sodium *** and muscle relaxer *** for pain relief.  Advised patient to avoid other NSAIDs while taking ***Naprosyn. Tylenol and Lidocaine patches PRN for multimodal pain relief. Counseled patient on red flag symptoms and when to seek immediate care.  ***No red flags suggesting cauda equina syndrome or progressive major motor weakness. Patient to follow up with orthopedic provider if symptoms do not improve with conservative treatment.  Return and ED precautions given.    Discussed MDM, treatment plan and plan for follow-up with patient/parent who agrees with plan.   Final Clinical Impressions(s) / UC Diagnoses   Final diagnoses:  None   Discharge Instructions   None    ED Prescriptions   None    PDMP not reviewed this  encounter.

## 2022-07-27 NOTE — ED Triage Notes (Signed)
Patient states that she fell outside her house and landed on the left side on the ground and concrete.  Patient c/o left sided rib pain and left shoulder.  Patient denies hitting her head and no LOC.  Patient does report N/V.

## 2022-07-29 ENCOUNTER — Ambulatory Visit
Admission: RE | Admit: 2022-07-29 | Discharge: 2022-07-29 | Disposition: A | Discharge status: Home / Self Care | Payer: Medicare HMO | Source: Ambulatory Visit | Attending: Family Medicine | Admitting: Family Medicine

## 2022-07-29 ENCOUNTER — Other Ambulatory Visit: Payer: Self-pay | Admitting: Family Medicine

## 2022-07-29 DIAGNOSIS — W19XXXA Unspecified fall, initial encounter: Secondary | ICD-10-CM

## 2022-07-29 DIAGNOSIS — M47816 Spondylosis without myelopathy or radiculopathy, lumbar region: Secondary | ICD-10-CM | POA: Diagnosis not present

## 2022-07-29 DIAGNOSIS — M19012 Primary osteoarthritis, left shoulder: Secondary | ICD-10-CM | POA: Diagnosis not present

## 2022-07-29 DIAGNOSIS — M25512 Pain in left shoulder: Secondary | ICD-10-CM | POA: Diagnosis not present

## 2022-07-29 DIAGNOSIS — W19XXXD Unspecified fall, subsequent encounter: Secondary | ICD-10-CM | POA: Diagnosis not present

## 2022-07-29 DIAGNOSIS — Z6823 Body mass index (BMI) 23.0-23.9, adult: Secondary | ICD-10-CM | POA: Diagnosis not present

## 2022-07-29 DIAGNOSIS — M545 Low back pain, unspecified: Secondary | ICD-10-CM | POA: Diagnosis not present

## 2022-08-06 DIAGNOSIS — M25512 Pain in left shoulder: Secondary | ICD-10-CM | POA: Diagnosis not present

## 2022-08-14 ENCOUNTER — Other Ambulatory Visit: Payer: Self-pay | Admitting: Pain Medicine

## 2022-08-14 DIAGNOSIS — E785 Hyperlipidemia, unspecified: Secondary | ICD-10-CM | POA: Diagnosis not present

## 2022-08-14 DIAGNOSIS — Z1231 Encounter for screening mammogram for malignant neoplasm of breast: Secondary | ICD-10-CM

## 2022-08-14 DIAGNOSIS — Z23 Encounter for immunization: Secondary | ICD-10-CM | POA: Diagnosis not present

## 2022-08-14 DIAGNOSIS — Z1239 Encounter for other screening for malignant neoplasm of breast: Secondary | ICD-10-CM | POA: Diagnosis not present

## 2022-08-14 DIAGNOSIS — M810 Age-related osteoporosis without current pathological fracture: Secondary | ICD-10-CM | POA: Diagnosis not present

## 2022-08-14 DIAGNOSIS — I10 Essential (primary) hypertension: Secondary | ICD-10-CM | POA: Diagnosis not present

## 2022-08-14 DIAGNOSIS — Z0000 Encounter for general adult medical examination without abnormal findings: Secondary | ICD-10-CM | POA: Diagnosis not present

## 2022-08-14 DIAGNOSIS — Z9181 History of falling: Secondary | ICD-10-CM | POA: Diagnosis not present

## 2022-08-28 ENCOUNTER — Other Ambulatory Visit: Payer: Self-pay | Admitting: Pain Medicine

## 2022-08-28 DIAGNOSIS — M542 Cervicalgia: Secondary | ICD-10-CM | POA: Diagnosis not present

## 2022-08-28 DIAGNOSIS — M25512 Pain in left shoulder: Secondary | ICD-10-CM | POA: Diagnosis not present

## 2022-08-28 DIAGNOSIS — Z780 Asymptomatic menopausal state: Secondary | ICD-10-CM

## 2022-09-03 DIAGNOSIS — M542 Cervicalgia: Secondary | ICD-10-CM | POA: Diagnosis not present

## 2022-09-03 DIAGNOSIS — M25512 Pain in left shoulder: Secondary | ICD-10-CM | POA: Diagnosis not present

## 2022-09-05 DIAGNOSIS — M542 Cervicalgia: Secondary | ICD-10-CM | POA: Diagnosis not present

## 2022-09-05 DIAGNOSIS — M25512 Pain in left shoulder: Secondary | ICD-10-CM | POA: Diagnosis not present

## 2022-09-09 DIAGNOSIS — M542 Cervicalgia: Secondary | ICD-10-CM | POA: Diagnosis not present

## 2022-09-09 DIAGNOSIS — M25512 Pain in left shoulder: Secondary | ICD-10-CM | POA: Diagnosis not present

## 2022-09-11 DIAGNOSIS — M542 Cervicalgia: Secondary | ICD-10-CM | POA: Diagnosis not present

## 2022-09-11 DIAGNOSIS — M25512 Pain in left shoulder: Secondary | ICD-10-CM | POA: Diagnosis not present

## 2022-09-15 DIAGNOSIS — M25512 Pain in left shoulder: Secondary | ICD-10-CM | POA: Diagnosis not present

## 2022-09-15 DIAGNOSIS — M542 Cervicalgia: Secondary | ICD-10-CM | POA: Diagnosis not present

## 2022-09-23 DIAGNOSIS — M542 Cervicalgia: Secondary | ICD-10-CM | POA: Diagnosis not present

## 2022-09-23 DIAGNOSIS — M25512 Pain in left shoulder: Secondary | ICD-10-CM | POA: Diagnosis not present

## 2022-09-25 DIAGNOSIS — M542 Cervicalgia: Secondary | ICD-10-CM | POA: Diagnosis not present

## 2022-09-25 DIAGNOSIS — M25512 Pain in left shoulder: Secondary | ICD-10-CM | POA: Diagnosis not present

## 2022-10-02 DIAGNOSIS — M25512 Pain in left shoulder: Secondary | ICD-10-CM | POA: Diagnosis not present

## 2022-10-02 DIAGNOSIS — M542 Cervicalgia: Secondary | ICD-10-CM | POA: Diagnosis not present

## 2022-10-03 ENCOUNTER — Ambulatory Visit
Admission: RE | Admit: 2022-10-03 | Discharge: 2022-10-03 | Disposition: A | Discharge status: Home / Self Care | Payer: Medicare HMO | Source: Ambulatory Visit | Attending: Pain Medicine | Admitting: Pain Medicine

## 2022-10-03 DIAGNOSIS — Z1231 Encounter for screening mammogram for malignant neoplasm of breast: Secondary | ICD-10-CM

## 2022-10-07 DIAGNOSIS — M25512 Pain in left shoulder: Secondary | ICD-10-CM | POA: Diagnosis not present

## 2022-10-07 DIAGNOSIS — M542 Cervicalgia: Secondary | ICD-10-CM | POA: Diagnosis not present

## 2022-10-09 DIAGNOSIS — M25512 Pain in left shoulder: Secondary | ICD-10-CM | POA: Diagnosis not present

## 2022-10-09 DIAGNOSIS — M542 Cervicalgia: Secondary | ICD-10-CM | POA: Diagnosis not present

## 2022-10-17 DIAGNOSIS — M25512 Pain in left shoulder: Secondary | ICD-10-CM | POA: Diagnosis not present

## 2022-10-17 DIAGNOSIS — M542 Cervicalgia: Secondary | ICD-10-CM | POA: Diagnosis not present

## 2022-10-22 DIAGNOSIS — M25512 Pain in left shoulder: Secondary | ICD-10-CM | POA: Diagnosis not present

## 2022-10-22 DIAGNOSIS — M542 Cervicalgia: Secondary | ICD-10-CM | POA: Diagnosis not present

## 2022-10-27 ENCOUNTER — Encounter: Payer: Self-pay | Admitting: Pain Medicine

## 2022-11-05 DIAGNOSIS — M25512 Pain in left shoulder: Secondary | ICD-10-CM | POA: Diagnosis not present

## 2022-11-05 DIAGNOSIS — M542 Cervicalgia: Secondary | ICD-10-CM | POA: Diagnosis not present

## 2022-11-12 DIAGNOSIS — M25512 Pain in left shoulder: Secondary | ICD-10-CM | POA: Diagnosis not present

## 2022-11-12 DIAGNOSIS — M542 Cervicalgia: Secondary | ICD-10-CM | POA: Diagnosis not present

## 2022-11-26 DIAGNOSIS — M25512 Pain in left shoulder: Secondary | ICD-10-CM | POA: Diagnosis not present

## 2022-11-26 DIAGNOSIS — M542 Cervicalgia: Secondary | ICD-10-CM | POA: Diagnosis not present

## 2022-12-10 DIAGNOSIS — M542 Cervicalgia: Secondary | ICD-10-CM | POA: Diagnosis not present

## 2022-12-10 DIAGNOSIS — M25512 Pain in left shoulder: Secondary | ICD-10-CM | POA: Diagnosis not present

## 2023-03-19 ENCOUNTER — Ambulatory Visit
Admission: RE | Admit: 2023-03-19 | Discharge: 2023-03-19 | Disposition: A | Discharge status: Home / Self Care | Payer: Medicare HMO | Source: Ambulatory Visit | Attending: Pain Medicine | Admitting: Pain Medicine

## 2023-03-19 DIAGNOSIS — E2839 Other primary ovarian failure: Secondary | ICD-10-CM | POA: Diagnosis not present

## 2023-03-19 DIAGNOSIS — Z90722 Acquired absence of ovaries, bilateral: Secondary | ICD-10-CM | POA: Diagnosis not present

## 2023-03-19 DIAGNOSIS — Z780 Asymptomatic menopausal state: Secondary | ICD-10-CM

## 2023-03-19 DIAGNOSIS — N958 Other specified menopausal and perimenopausal disorders: Secondary | ICD-10-CM | POA: Diagnosis not present

## 2023-03-19 DIAGNOSIS — M8588 Other specified disorders of bone density and structure, other site: Secondary | ICD-10-CM | POA: Diagnosis not present

## 2023-10-07 ENCOUNTER — Institutional Professional Consult (permissible substitution) (HOSPITAL_BASED_OUTPATIENT_CLINIC_OR_DEPARTMENT_OTHER): Payer: Medicare HMO | Admitting: Internal Medicine

## 2024-01-28 DIAGNOSIS — J069 Acute upper respiratory infection, unspecified: Secondary | ICD-10-CM | POA: Diagnosis not present

## 2024-01-28 DIAGNOSIS — R0981 Nasal congestion: Secondary | ICD-10-CM | POA: Diagnosis not present

## 2024-06-14 NOTE — Progress Notes (Signed)
 Tiffany Wilcox                                          MRN: [id number]   06/14/2024   The [facility] Quality Team Specialist reviewed this patient medical record for the purposes of chart review for care gap closure. The following were reviewed: chart review for care gap closure-controlling blood pressure.    [facility] Quality Team
